# Patient Record
Sex: Female | Born: 1935 | Race: White | Hispanic: No | Smoking: Former smoker
Health system: Southern US, Community
[De-identification: ages and names within clinical notes are randomized; demographics above are authoritative.]

## PROBLEM LIST (undated history)

## (undated) DIAGNOSIS — J449 Chronic obstructive pulmonary disease, unspecified: Secondary | ICD-10-CM

## (undated) DIAGNOSIS — K219 Gastro-esophageal reflux disease without esophagitis: Secondary | ICD-10-CM

## (undated) DIAGNOSIS — I10 Essential (primary) hypertension: Secondary | ICD-10-CM

## (undated) DIAGNOSIS — R0600 Dyspnea, unspecified: Secondary | ICD-10-CM

## (undated) DIAGNOSIS — F32A Depression, unspecified: Secondary | ICD-10-CM

## (undated) DIAGNOSIS — F329 Major depressive disorder, single episode, unspecified: Secondary | ICD-10-CM

## (undated) DIAGNOSIS — F419 Anxiety disorder, unspecified: Secondary | ICD-10-CM

## (undated) HISTORY — PX: ABDOMINAL HYSTERECTOMY: SHX81

## (undated) HISTORY — PX: COLONOSCOPY: SHX174

## (undated) HISTORY — PX: BACK SURGERY: SHX140

## (undated) HISTORY — PX: CYSTECTOMY: SUR359

## (undated) HISTORY — PX: OTHER SURGICAL HISTORY: SHX169

## (undated) HISTORY — PX: ESOPHAGOGASTRODUODENOSCOPY: SHX1529

## (undated) HISTORY — PX: VEIN SURGERY: SHX48

---

## 2017-07-30 ENCOUNTER — Encounter (HOSPITAL_COMMUNITY): Payer: Self-pay

## 2017-07-30 ENCOUNTER — Encounter (HOSPITAL_COMMUNITY)
Admission: AD | Disposition: A | Discharge status: Home Health | Payer: Self-pay | Source: Other Acute Inpatient Hospital | Attending: Family Medicine

## 2017-07-30 ENCOUNTER — Observation Stay (HOSPITAL_COMMUNITY): Payer: Medicare Other | Admitting: Anesthesiology

## 2017-07-30 ENCOUNTER — Observation Stay (HOSPITAL_COMMUNITY)
Admission: AD | Admit: 2017-07-30 | Discharge: 2017-08-01 | Disposition: A | Discharge status: Home Health | Payer: Medicare Other | Source: Other Acute Inpatient Hospital | Attending: Family Medicine | Admitting: Family Medicine

## 2017-07-30 ENCOUNTER — Other Ambulatory Visit: Payer: Self-pay

## 2017-07-30 DIAGNOSIS — Z886 Allergy status to analgesic agent status: Secondary | ICD-10-CM | POA: Diagnosis not present

## 2017-07-30 DIAGNOSIS — J449 Chronic obstructive pulmonary disease, unspecified: Secondary | ICD-10-CM | POA: Insufficient documentation

## 2017-07-30 DIAGNOSIS — K6289 Other specified diseases of anus and rectum: Secondary | ICD-10-CM | POA: Insufficient documentation

## 2017-07-30 DIAGNOSIS — Z79899 Other long term (current) drug therapy: Secondary | ICD-10-CM | POA: Diagnosis not present

## 2017-07-30 DIAGNOSIS — K5730 Diverticulosis of large intestine without perforation or abscess without bleeding: Secondary | ICD-10-CM | POA: Insufficient documentation

## 2017-07-30 DIAGNOSIS — E785 Hyperlipidemia, unspecified: Secondary | ICD-10-CM | POA: Insufficient documentation

## 2017-07-30 DIAGNOSIS — D62 Acute posthemorrhagic anemia: Secondary | ICD-10-CM | POA: Insufficient documentation

## 2017-07-30 DIAGNOSIS — R0609 Other forms of dyspnea: Secondary | ICD-10-CM | POA: Insufficient documentation

## 2017-07-30 DIAGNOSIS — F329 Major depressive disorder, single episode, unspecified: Secondary | ICD-10-CM | POA: Diagnosis not present

## 2017-07-30 DIAGNOSIS — K2950 Unspecified chronic gastritis without bleeding: Secondary | ICD-10-CM | POA: Diagnosis not present

## 2017-07-30 DIAGNOSIS — Z882 Allergy status to sulfonamides status: Secondary | ICD-10-CM | POA: Insufficient documentation

## 2017-07-30 DIAGNOSIS — K449 Diaphragmatic hernia without obstruction or gangrene: Secondary | ICD-10-CM | POA: Diagnosis not present

## 2017-07-30 DIAGNOSIS — D123 Benign neoplasm of transverse colon: Secondary | ICD-10-CM | POA: Diagnosis not present

## 2017-07-30 DIAGNOSIS — K219 Gastro-esophageal reflux disease without esophagitis: Secondary | ICD-10-CM | POA: Insufficient documentation

## 2017-07-30 DIAGNOSIS — R0600 Dyspnea, unspecified: Secondary | ICD-10-CM | POA: Diagnosis not present

## 2017-07-30 DIAGNOSIS — K222 Esophageal obstruction: Secondary | ICD-10-CM | POA: Insufficient documentation

## 2017-07-30 DIAGNOSIS — D120 Benign neoplasm of cecum: Secondary | ICD-10-CM | POA: Diagnosis not present

## 2017-07-30 DIAGNOSIS — K921 Melena: Principal | ICD-10-CM | POA: Diagnosis present

## 2017-07-30 DIAGNOSIS — F419 Anxiety disorder, unspecified: Secondary | ICD-10-CM | POA: Insufficient documentation

## 2017-07-30 DIAGNOSIS — D125 Benign neoplasm of sigmoid colon: Secondary | ICD-10-CM

## 2017-07-30 DIAGNOSIS — I10 Essential (primary) hypertension: Secondary | ICD-10-CM

## 2017-07-30 HISTORY — DX: Dyspnea, unspecified: R06.00

## 2017-07-30 HISTORY — DX: Anxiety disorder, unspecified: F41.9

## 2017-07-30 HISTORY — DX: Depression, unspecified: F32.A

## 2017-07-30 HISTORY — DX: Essential (primary) hypertension: I10

## 2017-07-30 HISTORY — DX: Gastro-esophageal reflux disease without esophagitis: K21.9

## 2017-07-30 HISTORY — DX: Chronic obstructive pulmonary disease, unspecified: J44.9

## 2017-07-30 HISTORY — DX: Major depressive disorder, single episode, unspecified: F32.9

## 2017-07-30 HISTORY — PX: ESOPHAGOGASTRODUODENOSCOPY (EGD) WITH PROPOFOL: SHX5813

## 2017-07-30 LAB — CBC
HCT: 26.8 % — ABNORMAL LOW (ref 36.0–46.0)
Hemoglobin: 8.5 g/dL — ABNORMAL LOW (ref 12.0–15.0)
MCH: 28.6 pg (ref 26.0–34.0)
MCHC: 31.7 g/dL (ref 30.0–36.0)
MCV: 90.2 fL (ref 78.0–100.0)
PLATELETS: 241 10*3/uL (ref 150–400)
RBC: 2.97 MIL/uL — AB (ref 3.87–5.11)
RDW: 17.1 % — ABNORMAL HIGH (ref 11.5–15.5)
WBC: 6.9 10*3/uL (ref 4.0–10.5)

## 2017-07-30 LAB — COMPREHENSIVE METABOLIC PANEL
ALT: 12 U/L — AB (ref 14–54)
AST: 19 U/L (ref 15–41)
Albumin: 3.3 g/dL — ABNORMAL LOW (ref 3.5–5.0)
Alkaline Phosphatase: 55 U/L (ref 38–126)
Anion gap: 12 (ref 5–15)
BUN: 27 mg/dL — AB (ref 6–20)
CHLORIDE: 102 mmol/L (ref 101–111)
CO2: 20 mmol/L — AB (ref 22–32)
Calcium: 8.7 mg/dL — ABNORMAL LOW (ref 8.9–10.3)
Creatinine, Ser: 0.84 mg/dL (ref 0.44–1.00)
GFR calc Af Amer: >60 mL/min (ref >60)
GFR calc non Af Amer: >60 mL/min (ref >60)
GLUCOSE: 96 mg/dL (ref 65–99)
Potassium: 3.6 mmol/L (ref 3.5–5.1)
SODIUM: 134 mmol/L — AB (ref 135–145)
Total Bilirubin: 0.8 mg/dL (ref 0.3–1.2)
Total Protein: 5.9 g/dL — ABNORMAL LOW (ref 6.5–8.1)

## 2017-07-30 SURGERY — ESOPHAGOGASTRODUODENOSCOPY (EGD) WITH PROPOFOL
Anesthesia: Monitor Anesthesia Care

## 2017-07-30 MED ORDER — PANTOPRAZOLE SODIUM 40 MG PO TBEC
40.0000 mg | DELAYED_RELEASE_TABLET | Freq: Two times a day (BID) | ORAL | Status: DC
  Administered 2017-07-30 – 2017-08-01 (×3): 40 mg via ORAL
  Filled 2017-07-30 (×4): qty 1

## 2017-07-30 MED ORDER — MIDAZOLAM HCL 2 MG/2ML IJ SOLN
1.0000 mg | INTRAMUSCULAR | Status: DC
  Administered 2017-07-30: 2 mg via INTRAVENOUS

## 2017-07-30 MED ORDER — POTASSIUM CHLORIDE IN NACL 20-0.9 MEQ/L-% IV SOLN
INTRAVENOUS | Status: DC
  Administered 2017-07-30: 16:00:00 via INTRAVENOUS

## 2017-07-30 MED ORDER — IPRATROPIUM-ALBUTEROL 0.5-2.5 (3) MG/3ML IN SOLN
RESPIRATORY_TRACT | Status: AC
  Filled 2017-07-30: qty 3

## 2017-07-30 MED ORDER — SODIUM CHLORIDE 0.9 % IV SOLN
INTRAVENOUS | Status: DC
  Administered 2017-07-30: 05:00:00 via INTRAVENOUS

## 2017-07-30 MED ORDER — LIDOCAINE VISCOUS 2 % MT SOLN
15.0000 mL | Freq: Once | OROMUCOSAL | Status: AC
  Administered 2017-07-30: 6 mL via OROMUCOSAL

## 2017-07-30 MED ORDER — GABAPENTIN 100 MG PO CAPS
100.0000 mg | ORAL_CAPSULE | Freq: Three times a day (TID) | ORAL | Status: DC
  Administered 2017-07-30 – 2017-08-01 (×7): 100 mg via ORAL
  Filled 2017-07-30 (×7): qty 1

## 2017-07-30 MED ORDER — SODIUM CHLORIDE 0.9 % IV SOLN: INTRAVENOUS | Status: DC

## 2017-07-30 MED ORDER — ATORVASTATIN CALCIUM 10 MG PO TABS
10.0000 mg | ORAL_TABLET | Freq: Every day | ORAL | Status: DC
  Administered 2017-07-30 – 2017-08-01 (×3): 10 mg via ORAL
  Filled 2017-07-30 (×3): qty 1

## 2017-07-30 MED ORDER — CLONAZEPAM 0.5 MG PO TABS: 0.5000 mg | ORAL_TABLET | Freq: Two times a day (BID) | ORAL | Status: DC | PRN

## 2017-07-30 MED ORDER — LIDOCAINE VISCOUS 2 % MT SOLN
OROMUCOSAL | Status: AC
  Filled 2017-07-30: qty 15

## 2017-07-30 MED ORDER — LACTATED RINGERS IV SOLN
INTRAVENOUS | Status: DC
  Administered 2017-07-30: 12:00:00 via INTRAVENOUS

## 2017-07-30 MED ORDER — IPRATROPIUM-ALBUTEROL 0.5-2.5 (3) MG/3ML IN SOLN
3.0000 mL | Freq: Once | RESPIRATORY_TRACT | Status: AC
  Administered 2017-07-30: 3 mL via RESPIRATORY_TRACT

## 2017-07-30 MED ORDER — FLUOXETINE HCL 20 MG PO CAPS
60.0000 mg | ORAL_CAPSULE | Freq: Every day | ORAL | Status: DC
  Administered 2017-07-30 – 2017-08-01 (×3): 60 mg via ORAL
  Filled 2017-07-30 (×4): qty 3

## 2017-07-30 MED ORDER — ACETAMINOPHEN 325 MG PO TABS
650.0000 mg | ORAL_TABLET | Freq: Four times a day (QID) | ORAL | Status: DC | PRN
  Administered 2017-08-01: 650 mg via ORAL
  Filled 2017-07-30: qty 2

## 2017-07-30 MED ORDER — ONDANSETRON HCL 4 MG/2ML IJ SOLN
4.0000 mg | Freq: Four times a day (QID) | INTRAMUSCULAR | Status: DC | PRN
  Administered 2017-07-30: 4 mg via INTRAVENOUS
  Filled 2017-07-30: qty 2

## 2017-07-30 MED ORDER — PANTOPRAZOLE SODIUM 40 MG IV SOLR
40.0000 mg | Freq: Two times a day (BID) | INTRAVENOUS | Status: DC
  Administered 2017-07-30: 40 mg via INTRAVENOUS
  Filled 2017-07-30: qty 40

## 2017-07-30 MED ORDER — SIMETHICONE 40 MG/0.6ML PO SUSP
ORAL | Status: DC | PRN
  Administered 2017-07-30: 13:00:00

## 2017-07-30 MED ORDER — ONDANSETRON HCL 4 MG PO TABS: 4.0000 mg | ORAL_TABLET | Freq: Four times a day (QID) | ORAL | Status: DC | PRN

## 2017-07-30 MED ORDER — MIDAZOLAM HCL 2 MG/2ML IJ SOLN
INTRAMUSCULAR | Status: AC
  Filled 2017-07-30: qty 2

## 2017-07-30 MED ORDER — CARISOPRODOL 350 MG PO TABS
350.0000 mg | ORAL_TABLET | Freq: Four times a day (QID) | ORAL | Status: DC
  Administered 2017-07-30 – 2017-08-01 (×9): 350 mg via ORAL
  Filled 2017-07-30 (×10): qty 1

## 2017-07-30 MED ORDER — PROPRANOLOL HCL 20 MG PO TABS
40.0000 mg | ORAL_TABLET | Freq: Two times a day (BID) | ORAL | Status: DC
  Administered 2017-07-30 – 2017-08-01 (×5): 40 mg via ORAL
  Filled 2017-07-30 (×5): qty 2

## 2017-07-30 MED ORDER — PEG 3350-KCL-NA BICARB-NACL 420 G PO SOLR
4000.0000 mL | Freq: Once | ORAL | Status: AC
  Administered 2017-07-30: 4000 mL via ORAL
  Filled 2017-07-30: qty 4000

## 2017-07-30 MED ORDER — FENTANYL CITRATE (PF) 100 MCG/2ML IJ SOLN
INTRAMUSCULAR | Status: AC
  Filled 2017-07-30: qty 2

## 2017-07-30 MED ORDER — ACETAMINOPHEN 650 MG RE SUPP: 650.0000 mg | Freq: Four times a day (QID) | RECTAL | Status: DC | PRN

## 2017-07-30 MED ORDER — PROPOFOL 500 MG/50ML IV EMUL
INTRAVENOUS | Status: DC | PRN
  Administered 2017-07-30: 150 ug/kg/min via INTRAVENOUS

## 2017-07-30 MED ORDER — HYDROMORPHONE HCL 1 MG/ML IJ SOLN
0.5000 mg | INTRAMUSCULAR | Status: DC | PRN
  Administered 2017-07-30: 0.5 mg via INTRAVENOUS
  Filled 2017-07-30: qty 0.5

## 2017-07-30 NOTE — H&P (View-Only) (Signed)
Referring Provider: No ref. provider found Primary Care Physician:  Patient, No Pcp Per Primary Gastroenterologist:  Dr. Oneida Alar [facility])  Date of Admission: 07/29/17 Date of Consultation: 07/30/17  Reason for Consultation:  Melena, anemia  HPI:  Angela Oconnell is a 82 y.o. female with a past medical history of GERD, hypertension, COPD, anxiety.  She was transferred from [facility] where she reported complaining of black stool at home.  She just received a steroid shot in her hip for osteoarthritis which she has been getting monthly for the past 6 months.  Per hospitalist note she denied nausea, vomiting, hematemesis, hematochezia, chest pain.  However, she was complaining of shortness of breath and a cough.  She denied NSAIDs.  Heme positive in the emergency department at [place], [place].  No history of peptic ulcer disease.  Per the patient she recently had an EGD and colonoscopy done at [place], [place] 2 years ago which was essentially normal.  Hemoglobin in the emergency room in [place], [place] was 9.0.  CBC this morning with a decline to 8.5.  Platelets normal.  CMP shows BUN elevated at 27, normal creatinine.  LFTs essentially normal.  Today she states   Baseline COPD dyspnea worse past couple days. Feels very weak, been in the bed the past couple days. Lower abdominal pain feels like her typical IBS-D symptoms. 3+ black stools yesterday. Has had persistent black BMs today. Denies CP. Denies NSAIDs, ASA powders, ASA. GERD well managed on PPI. No upper abdominal pain. Some nausea/dry heaves, no vomiting. No hematochezia, fever, chills.  Past Medical History:  Diagnosis Date  . Anxiety   . COPD (chronic obstructive pulmonary disease) ([place])   . Depression   . Dyspnea   . GERD (gastroesophageal reflux disease)   . Hypertension       Prior to Admission medications   Medication Sig Start Date End Date Taking? Authorizing Provider  acidophilus  (RISAQUAD) CAPS capsule Take 1 capsule by mouth daily.   Yes [provider]  atorvastatin (LIPITOR) 10 MG tablet Take 10 mg by mouth daily.   Yes [provider]  carisoprodol (SOMA) 350 MG tablet Take 350 mg by mouth 4 (four) times daily.   Yes [provider]  clonazePAM (KLONOPIN) 0.5 MG tablet Take 0.5 mg by mouth 2 (two) times daily as needed for anxiety.   Yes [provider]  FLUoxetine (PROZAC) 20 MG tablet Take 60 mg by mouth daily.   Yes [provider]  gabapentin (NEURONTIN) 300 MG capsule Take 100 mg by mouth 3 (three) times daily.   Yes [provider]  pantoprazole (PROTONIX) 40 MG tablet Take 40 mg by mouth daily.   Yes [provider]  propranolol (INDERAL) 80 MG tablet Take 40 mg by mouth 2 (two) times daily.   Yes [provider]    Current Facility-Administered Medications  Medication Dose Route Frequency Provider Last Rate Last Dose  . 0.9 %  sodium chloride infusion   Intravenous Continuous Oswald Hillock, MD 75 mL/hr at 07/30/17 0519    . acetaminophen (TYLENOL) tablet 650 mg  650 mg Oral Q6H PRN Oswald Hillock, MD       Or  . acetaminophen (TYLENOL) suppository 650 mg  650 mg Rectal Q6H PRN Oswald Hillock, MD      . atorvastatin (LIPITOR) tablet 10 mg  10 mg Oral Daily Oswald Hillock, MD      . carisoprodol (SOMA) tablet 350 mg  350 mg Oral QID Oswald Hillock, MD      . clonazePAM Bobbye Charleston) tablet 0.5 mg  0.5 mg Oral BID PRN Oswald Hillock, MD      . FLUoxetine (PROZAC) capsule 60 mg  60 mg Oral Daily Darrick Meigs, Marge Duncans, MD      . gabapentin (NEURONTIN) capsule 100 mg  100 mg Oral TID Oswald Hillock, MD      . HYDROmorphone (DILAUDID) injection 0.5 mg  0.5 mg Intravenous Q4H PRN Oswald Hillock, MD      . ondansetron (ZOFRAN) tablet 4 mg  4 mg Oral Q6H PRN Oswald Hillock, MD       Or  . ondansetron (ZOFRAN) injection 4 mg  4 mg Intravenous Q6H PRN Oswald Hillock, MD      . pantoprazole (PROTONIX) injection 40  mg  40 mg Intravenous Q12H Lama, Marge Duncans, MD      . propranolol (INDERAL) tablet 40 mg  40 mg Oral BID Oswald Hillock, MD        Allergies as of 07/30/2017 - never reviewed  Allergen Reaction Noted  . Motrin [ibuprofen]  07/30/2017  . Sulfur  07/30/2017    No family history on file.  Social History   Socioeconomic History  . Marital status: Unknown    Spouse name: Not on file  . Number of children: Not on file  . Years of education: Not on file  . Highest education level: Not on file  Social Needs  . Financial resource strain: Not on file  . Food insecurity - worry: Not on file  . Food insecurity - inability: Not on file  . Transportation needs - medical: Not on file  . Transportation needs - non-medical: Not on file  Occupational History  . Not on file  Tobacco Use  . Smoking status: Not on file  Substance and Sexual Activity  . Alcohol use: No    Frequency: Never  . Drug use: No  . Sexual activity: No  Other Topics Concern  . Not on file  Social History Narrative  . Not on file    Review of Systems: General: Negative for anorexia, weight loss, fever, chills, fatigue, weakness. Eyes: Negative for vision changes.  ENT: Negative for hoarseness, difficulty swallowing , nasal congestion. CV: Negative for chest pain, angina, palpitations, dyspnea on exertion, peripheral edema.  Respiratory: Negative for dyspnea at rest, dyspnea on exertion, cough, sputum, wheezing.  GI: See history of present illness. GU:  Negative for dysuria, hematuria, urinary incontinence, urinary frequency, nocturnal urination.  MS: Negative for joint pain, low back pain.  Derm: Negative for rash or itching.  Neuro: Negative for weakness, abnormal sensation, seizure, frequent headaches, memory loss, confusion.  Psych: Negative for anxiety, depression, suicidal ideation, hallucinations.  Endo: Negative for unusual weight change.  Heme: Negative for bruising or bleeding. Allergy: Negative for rash  or hives.  Physical Exam: Vital signs in last 24 hours: Temp:  [98.6 F (37 C)] 98.6 F (37 C) (03/08 0328) Pulse Rate:  [82] 82 (03/08 0328) Resp:  [18] 18 (03/08 0328) BP: (160)/(92) 160/92 (03/08 0328) SpO2:  [95 %] 95 % (03/08 0328) Weight:  [120 lb 9.5 oz (54.7 kg)] 120 lb 9.5 oz (54.7 kg) (03/08 0328) Last BM Date: 07/30/17 General:   Alert,  Well-developed, well-nourished, pleasant and cooperative in NAD Head:  Normocephalic and atraumatic. Eyes:  Sclera clear, no icterus.   Conjunctiva pink. Ears:  Normal auditory acuity. Nose:  No deformity, discharge,  or lesions. Mouth:  No deformity or lesions, dentition normal. Neck:  Supple; no masses or thyromegaly. Lungs:  Clear throughout to auscultation.   No wheezes, crackles, or rhonchi. No acute distress. Heart:  Regular rate and rhythm; no murmurs, clicks, rubs,  or gallops. Abdomen:  Soft, nontender and nondistended. No masses, hepatosplenomegaly or hernias noted. Normal bowel sounds, without guarding, and without rebound.   Rectal:  Deferred until time of colonoscopy.   Msk:  Symmetrical without gross deformities. Normal posture. Pulses:  Normal pulses noted. Extremities:  Without clubbing or edema. Neurologic:  Alert and  oriented x4;  grossly normal neurologically. Skin:  Intact without significant lesions or rashes. Cervical Nodes:  No significant cervical adenopathy. Psych:  Alert and cooperative. Normal mood and affect.  Intake/Output from previous day: No intake/output data recorded. Intake/Output this shift: No intake/output data recorded.  Lab Results: Recent Labs    07/30/17 0548  WBC 6.9  HGB 8.5*  HCT 26.8*  PLT 241   BMET Recent Labs    07/30/17 0548  NA 134*  K 3.6  CL 102  CO2 20*  GLUCOSE 96  BUN 27*  CREATININE 0.84  CALCIUM 8.7*   LFT Recent Labs    07/30/17 0548  PROT 5.9*  ALBUMIN 3.3*  AST 19  ALT 12*  ALKPHOS 55  BILITOT 0.8   PT/INR No results for input(s): LABPROT,  INR in the last 72 hours. Hepatitis Panel No results for input(s): HEPBSAG, HCVAB, HEPAIGM, HEPBIGM in the last 72 hours. C-Diff No results for input(s): CDIFFTOX in the last 72 hours.  Studies/Results: No results found.  Impression:   Plan: ? EGD ??PROPOFOL CBC this afternoon Monitor for GIB Transfuse as necessary   LOS: 1 day     07/30/2017, 8:24 AM

## 2017-07-30 NOTE — Transfer of Care (Signed)
Immediate Anesthesia Transfer of Care Note  Patient: Angela Oconnell  Procedure(s) Performed: ESOPHAGOGASTRODUODENOSCOPY (EGD) WITH PROPOFOL (N/A )  Patient Location: PACU  Anesthesia Type:MAC  Level of Consciousness: awake and alert   Airway & Oxygen Therapy: Patient Spontanous Breathing and Patient connected to nasal cannula oxygen  Post-op Assessment: Report given to RN  Post vital signs: Reviewed and stable  Last Vitals:  Vitals:   07/30/17 0328 07/30/17 1145  BP: (!) 160/92 (!) 117/58  Pulse: 82 71  Resp: 18   Temp: 37 C 36.8 C  SpO2: 95%     Last Pain:  Vitals:   07/30/17 1145  TempSrc: Oral  PainSc: 0-No pain      Patients Stated Pain Goal: 5 (98/33/82 5053)  Complications: No apparent anesthesia complications

## 2017-07-30 NOTE — Progress Notes (Signed)
PROGRESS NOTE    Angela Oconnell  WJX:[id number]  DOB: 1935-12-26  DOA: 07/30/2017 PCP: Patient, No Pcp Per   Brief Admission Hx: Angela Oconnell  is a 82 y.o. female, with history of depression, hypertension, COPD who came to the hospital after she noticed black stool at home.   MDM/Assessment & Plan:     1. Melena-unclear etiology, EGD was unrevealing as cause of melena, GI planning for colon survey tomorrow with Dr. Laural Golden.  Colon prep in process to start now.  2. Anemia-patient's hemoglobin was around 9, will repeat CBC in a.m. vitals are stable.  Stool guaiac was positive in the ED 3. Hyperlipidemia-continue Lipitor 4. Depression-continue Klonopin, Prozac 5. Hypertension-blood pressure stable, continue propranolol  DVT Prophylaxis-   SCDs   AM Labs Ordered, also please review Full Orders  Family Communication: Admission, patients condition and plan of care including tests being ordered have been discussed with the patient  who indicate understanding and agree with the plan and Code Status.  Code Status:  DNR  Consultants:  GI  Procedures:  EGD   Colonoscopy   Subjective: Pt without complaints, wants to eat something, anxious for procedure later today  Objective: Vitals:   07/30/17 1145 07/30/17 1315 07/30/17 1330 07/30/17 1340  BP: (!) 117/58 (!) 92/50 (!) 106/54   Pulse: 71 62 67 65  Resp:  19 (!) 21 (!) 22  Temp: 98.2 F (36.8 C) (!) 97.2 F (36.2 C)    TempSrc: Oral     SpO2:  95% 96% 95%  Weight: 54.4 kg (120 lb)     Height: 4\' 10"  (1.473 m)       Intake/Output Summary (Last 24 hours) at 07/30/2017 1518 Last data filed at 07/30/2017 1318 Gross per 24 hour  Intake 800 ml  Output 0 ml  Net 800 ml   Filed Weights   07/30/17 0328 07/30/17 1145  Weight: 54.7 kg (120 lb 9.5 oz) 54.4 kg (120 lb)     REVIEW OF SYSTEMS  As per history otherwise all reviewed and reported negative  Exam:  General exam: awake, alert, NAD, cooperative.    Respiratory system: Clear. No increased work of breathing. Cardiovascular system: S1 & S2 heard, RRR. No JVD, murmurs, gallops, clicks or pedal edema. Gastrointestinal system: Abdomen is nondistended, soft and nontender. Normal bowel sounds heard. Central nervous system: Alert and oriented. No focal neurological deficits. Extremities: no CCE.  Data Reviewed: Basic Metabolic Panel: Recent Labs  Lab 07/30/17 0548  NA 134*  K 3.6  CL 102  CO2 20*  GLUCOSE 96  BUN 27*  CREATININE 0.84  CALCIUM 8.7*   Liver Function Tests: Recent Labs  Lab 07/30/17 0548  AST 19  ALT 12*  ALKPHOS 55  BILITOT 0.8  PROT 5.9*  ALBUMIN 3.3*   No results for input(s): LIPASE, AMYLASE in the last 168 hours. No results for input(s): AMMONIA in the last 168 hours. CBC: Recent Labs  Lab 07/30/17 0548  WBC 6.9  HGB 8.5*  HCT 26.8*  MCV 90.2  PLT 241   Cardiac Enzymes: No results for input(s): CKTOTAL, CKMB, CKMBINDEX, TROPONINI in the last 168 hours. CBG (last 3)  No results for input(s): GLUCAP in the last 72 hours. No results found for this or any previous visit (from the past 240 hour(s)).   Studies: No results found.  Scheduled Meds: . atorvastatin  10 mg Oral Daily  . carisoprodol  350 mg Oral QID  . FLUoxetine  60 mg Oral  Daily  . gabapentin  100 mg Oral TID  . pantoprazole  40 mg Oral BID AC  . propranolol  40 mg Oral BID   Continuous Infusions: . 0.9 % NaCl with KCl 20 mEq / L      Active Problems:   Melena   HTN (hypertension)   Time spent:   Irwin Brakeman, MD, FAAFP [facility] Pager [phone]  If 7PM-7AM, please contact night-coverage [email or web address] Password [email or web address] 07/30/2017, 3:18 PM    LOS: 1 day

## 2017-07-30 NOTE — Plan of Care (Signed)
SPOKE WITH SON, MICHAEL Hults. DISCUSSED PROCEDURE, BENEFITS, & RISKS: < 1% chance of medication reaction, bleeding, perforation, or rupture of spleen/liver. AWARE DR. Laural Golden WILL DO TCS W/ MAC ON MAR 9.

## 2017-07-30 NOTE — Anesthesia Preprocedure Evaluation (Addendum)
Anesthesia Evaluation  Patient identified by MRN, date of birth, ID band Patient awake    Reviewed: Allergy & Precautions, NPO status , Patient's Chart, lab work & pertinent test results, reviewed documented beta blocker date and time   Airway Mallampati: I  TM Distance: >3 FB Neck ROM: Full    Dental  (+) Teeth Intact, Partial Upper   Pulmonary shortness of breath and with exertion, COPD,    breath sounds clear to auscultation       Cardiovascular hypertension, Pt. on medications and Pt. on home beta blockers + DOE   Rhythm:Regular Rate:Normal     Neuro/Psych PSYCHIATRIC DISORDERS Anxiety Depression    GI/Hepatic GERD  ,  Endo/Other    Renal/GU      Musculoskeletal   Abdominal   Peds  Hematology   Anesthesia Other Findings   Reproductive/Obstetrics                             Anesthesia Physical Anesthesia Plan  ASA: III  Anesthesia Plan: MAC   Post-op Pain Management:    Induction: Intravenous  PONV Risk Score and Plan:   Airway Management Planned: Simple Face Mask  Additional Equipment:   Intra-op Plan:   Post-operative Plan:   Informed Consent: I have reviewed the patients History and Physical, chart, labs and discussed the procedure including the risks, benefits and alternatives for the proposed anesthesia with the patient or authorized representative who has indicated his/her understanding and acceptance.     Plan Discussed with:   Anesthesia Plan Comments:         Anesthesia Quick Evaluation

## 2017-07-30 NOTE — Care Management Obs Status (Signed)
[facility] NOTIFICATION   Patient Details  Name: Angela Oconnell MRN: [id number] Date of Birth: 1935-10-31   Medicare Observation Status Notification Given:  Yes    Sherald Barge, RN 07/30/2017, 2:04 PM

## 2017-07-30 NOTE — H&P (Signed)
TRH H&P    Patient Demographics:    Angela Oconnell, is a 82 y.o. female  MRN: [id number]  DOB - Oct 08, 1935  Admit Date - 07/30/2017   Outpatient Primary MD for the patient is Patient, No Pcp Per  Patient coming from: Transferred from [facility]   Chief complaint- Black stool   HPI:    Angela Oconnell  is a 82 y.o. female, with history of depression, hypertension, COPD who came to the hospital after she noticed black stool at home.  Patient says that she got steroid shot in her hip for osteoarthritis and she has been getting this once a month over the past 6 months.  After she got the shot she noticed black colored stool and abdominal pain.  She denies nausea Vomiting or diarrhea.  Denies vomiting any blood. No bright red blood per rectum No chest pain,  but complains of shortness of breath. Also complains of cough  She denies taking NSAIDs.  Guaiac was positive in the ED at [facility]  No previous history of gastric ulcer.  She had a EGD and colonoscopy done at [facility] 2 years ago which did not show significant abnormality.     Review of systems:     All other systems reviewed and are negative.   With Past History of the following :    Past Medical History:  Diagnosis Date  . Anxiety   . COPD (chronic obstructive pulmonary disease) ([place])   . Depression   . Dyspnea   . GERD (gastroesophageal reflux disease)   . Hypertension          Social History:      Social History   Tobacco Use  . Smoking status: Not on file  Substance Use Topics  . Alcohol use: No    Frequency: Never       Family History :   Patient father died of MI at the age of 31   Home Medications:   Prior to Admission medications   Medication Sig Start Date End Date Taking? Authorizing Provider  acidophilus (RISAQUAD) CAPS capsule Take 1 capsule by mouth daily.   Yes [provider]    atorvastatin (LIPITOR) 10 MG tablet Take 10 mg by mouth daily.   Yes [provider]  carisoprodol (SOMA) 350 MG tablet Take 350 mg by mouth 4 (four) times daily.   Yes [provider]  clonazePAM (KLONOPIN) 0.5 MG tablet Take 0.5 mg by mouth 2 (two) times daily as needed for anxiety.   Yes [provider]  FLUoxetine (PROZAC) 20 MG tablet Take 60 mg by mouth daily.   Yes [provider]  gabapentin (NEURONTIN) 300 MG capsule Take 100 mg by mouth 3 (three) times daily.   Yes [provider]  pantoprazole (PROTONIX) 40 MG tablet Take 40 mg by mouth daily.   Yes [provider]  propranolol (INDERAL) 80 MG tablet Take 40 mg by mouth 2 (two) times daily.   Yes [provider]     Allergies:  Allergies  Allergen Reactions  . Motrin [Ibuprofen]   . Sulfur      Physical Exam:   Vitals  Blood pressure (!) 160/92, pulse 82, temperature 98.6 F (37 C), temperature source Oral, resp. rate 18, height 4\' 10"  (1.473 m), weight 54.7 kg (120 lb 9.5 oz), SpO2 95 %.  1.  General: Appears in no acute distress  2. Psychiatric:  Intact judgement and  insight, awake alert, oriented x 3.  3. Neurologic: No focal neurological deficits, all cranial nerves intact.Strength 5/5 all 4 extremities, sensation intact all 4 extremities, plantars down going.  4. Eyes :  anicteric sclerae, moist conjunctivae with no lid lag. PERRLA.  5. ENMT:  Oropharynx clear with moist mucous membranes and good dentition  6. Neck:  supple, no cervical lymphadenopathy appriciated, No thyromegaly  7. Respiratory : Normal respiratory effort, good air movement bilaterally,clear to  auscultation bilaterally  8. Cardiovascular : RRR, no gallops, rubs or murmurs, no leg edema  9. Gastrointestinal:  Positive bowel sounds, abdomen soft, non-tender to palpation,no hepatosplenomegaly, no rigidity or guarding       10. Skin:  No cyanosis, normal texture and  turgor, no rash, lesions or ulcers  11.Musculoskeletal:  Good muscle tone,  joints appear normal , no effusions,  normal range of motion    Data Review:    CBC No results for input(s): WBC, HGB, HCT, PLT, MCV, MCH, MCHC, RDW, LYMPHSABS, MONOABS, EOSABS, BASOSABS, BANDABS in the last 168 hours.  Invalid input(s): NEUTRABS, BANDSABD ------------------------------------------------------------------------------------------------------------------  Chemistries  No results for input(s): NA, K, CL, CO2, GLUCOSE, BUN, CREATININE, CALCIUM, MG, AST, ALT, ALKPHOS, BILITOT in the last 168 hours.  Invalid input(s): GFRCGP ------------------------------------------------------------------------------------------------------------------  ------------------------------------------------------------------------------------------------------------------  --------------------------------------------------------------------------------------------------------------- Urine analysis: No results found for: COLORURINE, APPEARANCEUR, LABSPEC, [place], GLUCOSEU, HGBUR, BILIRUBINUR, KETONESUR, PROTEINUR, UROBILINOGEN, NITRITE, LEUKOCYTESUR    Imaging Results:       Assessment & Plan:    Active Problems:   Melena   1. Melena-unclear etiology, will keep her n.p.o, start IV Protonix, GI consult in a.m for possible EGD 2. Anemia-patient's hemoglobin was around 9, will repeat CBC in a.m. vitals are stable.  Stool guaiac was positive in the ED 3. Hyperlipidemia-continue Lipitor 4. Depression-continue Klonopin, Prozac 5. Hypertension-blood pressure stable, continue propranolol    DVT Prophylaxis-   SCDs   AM Labs Ordered, also please review Full Orders  Family Communication: Admission, patients condition and plan of care including tests being ordered have been discussed with the patient  who indicate understanding and agree with the plan and Code Status.  Code Status:  DNR  Admission status:  Observation  Time spent in minutes : 60 min   Oswald Hillock M.D on 07/30/2017 at 4:47 AM  Between 7am to 7pm - Pager - [phone]. After 7pm go to [email or web address] - password [facility]  [facility] - Office  [phone]

## 2017-07-30 NOTE — Interval H&P Note (Signed)
History and Physical Interval Note:  07/30/2017 11:54 AM  Angela Oconnell  has presented today for surgery, with the diagnosis of Melena, symptomatic anemia  The various methods of treatment have been discussed with the patient and family. After consideration of risks, benefits and other options for treatment, the patient has consented to  Procedure(s): ESOPHAGOGASTRODUODENOSCOPY (EGD) WITH PROPOFOL (N/A) as a surgical intervention .  The patient's history has been reviewed, patient examined, no change in status, stable for surgery.  I have reviewed the patient's chart and labs.  Questions were answered to the patient's satisfaction.     [employer]

## 2017-07-30 NOTE — Op Note (Signed)
[facility] Patient Name: Angela Oconnell Procedure Date: 07/30/2017 12:37 PM MRN: [id number] Date of Birth: 10/21/1935 Attending MD: Barney Drain MD, MD CSN: [id number] Age: 82 Admit Type: Outpatient Procedure:                Upper GI endoscopy WITH COLD FORCEPS BIOPSY Indications:              Melena-Hb 8.5 MCV 90.5. Providers:                Barney Drain MD, MD, Charlsie Quest. Theda Sers RN, RN,                            Nelma Rothman, Technician Referring MD:              Medicines:                Propofol per Anesthesia Complications:            No immediate complications. Estimated Blood Loss:     Estimated blood loss was minimal. Procedure:                Pre-Anesthesia Assessment:                           - Prior to the procedure, a History and Physical                            was performed, and patient medications and                            allergies were reviewed. The patient's tolerance of                            previous anesthesia was also reviewed. The risks                            and benefits of the procedure and the sedation                            options and risks were discussed with the patient.                            All questions were answered, and informed consent                            was obtained. Prior Anticoagulants: The patient has                            taken no previous anticoagulant or antiplatelet                            agents. ASA Grade Assessment: II - A patient with                            mild systemic disease. After reviewing the risks  and benefits, the patient was deemed in                            satisfactory condition to undergo the procedure.                            After obtaining informed consent, the endoscope was                            passed under direct vision. Throughout the                            procedure, the patient's blood pressure, pulse, and       oxygen saturations were monitored continuously. The                            EC-3490TLi ([id number]) scope was introduced through                            the mouth, and advanced to the second part of                            duodenum. The upper GI endoscopy was accomplished                            without difficulty. The patient tolerated the                            procedure well. Scope In: 1:01:41 PM Scope Out: 1:09:14 PM Total Procedure Duration: 0 hours 7 minutes 33 seconds  Findings:      One moderate (circumferential scarring or stenosis; an endoscope may       pass) benign-appearing, intrinsic stenosis was found. And was traversed.      A large hiatal hernia was present.      Patchy mildly erythematous mucosa without bleeding was found in the       gastric fundus. This was biopsied with a cold forceps for histology.      Patchy mild inflammation characterized by congestion (edema) and       erythema was found in the gastric antrum. Biopsies were taken with a       cold forceps for Helicobacter pylori testing.      The examined duodenum was normal. Impression:               - Benign-appearing esophageal stricture.                           - Large hiatal hernia.                           - Erythematous mucosa in the gastric fundus                            CONSISTENT WITH EARLY CAMERON'S LESIONS                           -  MILD Gastritis.                           - NO SOURCE FOR MELENA IDENTIFIED Moderate Sedation:      Per Anesthesia Care Recommendation:           - Await pathology results.                           - Clear liquid diet. NPO AFTER MN EXCEPT SIPS WITH                            MEDS.                           - Continue present medications. PREP FOR                            COLONOSCOPY MAR 9 W/ MAC.                           - Return patient to hospital ward for ongoing care. Procedure Code(s):        --- Professional ---                            [phone], Esophagogastroduodenoscopy, flexible,                            transoral; with biopsy, single or multiple Diagnosis Code(s):        --- Professional ---                           K22.2, Esophageal obstruction                           K44.9, Diaphragmatic hernia without obstruction or                            gangrene                           K31.89, Other diseases of stomach and duodenum                           K29.70, Gastritis, unspecified, without bleeding                           K92.1, Melena (includes Hematochezia) CPT copyright 2016 American Medical Association. All rights reserved. The codes documented in this report are preliminary and upon coder review may  be revised to meet current compliance requirements. Barney Drain, MD Barney Drain MD, MD 07/30/2017 1:21:20 PM This report has been signed electronically. Number of Addenda: 0

## 2017-07-30 NOTE — Anesthesia Postprocedure Evaluation (Signed)
Anesthesia Post Note  Patient: Angela Oconnell  Procedure(s) Performed: ESOPHAGOGASTRODUODENOSCOPY (EGD) WITH PROPOFOL (N/A )  Patient location during evaluation: PACU Anesthesia Type: MAC Level of consciousness: awake and alert and oriented Pain management: pain level controlled Vital Signs Assessment: post-procedure vital signs reviewed and stable Respiratory status: spontaneous breathing Cardiovascular status: blood pressure returned to baseline and stable Postop Assessment: no apparent nausea or vomiting Anesthetic complications: no     Last Vitals:  Vitals:   07/30/17 1315 07/30/17 1330  BP: (!) 92/50 (!) 106/54  Pulse: 62 67  Resp: 19 (!) 21  Temp: (!) 36.2 C   SpO2: 95% 96%    Last Pain:  Vitals:   07/30/17 1145  TempSrc: Oral  PainSc: 0-No pain                 Emil Weigold

## 2017-07-30 NOTE — Consult Note (Signed)
Referring Provider: No ref. provider found Primary Care Physician:  Patient, No Pcp Per Primary Gastroenterologist:  Dr. Oneida Alar [facility])  Date of Admission: 07/29/17 Date of Consultation: 07/30/17  Reason for Consultation:  Melena, anemia  HPI:  Angela Oconnell is a 82 y.o. female with a past medical history of GERD, hypertension, COPD, anxiety.  She was transferred from [facility] where she reported complaining of black stool at home.  She just received a steroid shot in her hip for osteoarthritis which she has been getting monthly for the past 6 months.  Per hospitalist note she denied nausea, vomiting, hematemesis, hematochezia, chest pain.  However, she was complaining of shortness of breath and a cough.  She denied NSAIDs.  Heme positive in the emergency department at [place], [place].  No history of peptic ulcer disease.  Per the patient she recently had an EGD and colonoscopy done at [place], [place] 2 years ago which was essentially normal.  Hemoglobin in the emergency room in [place], [place] was 9.0.  CBC this morning with a decline to 8.5.  Platelets normal.  CMP shows BUN elevated at 27, normal creatinine.  LFTs essentially normal.  Today she states   Baseline COPD dyspnea worse past couple days. Feels very weak, been in the bed the past couple days. Lower abdominal pain feels like her typical IBS-D symptoms. 3+ black stools yesterday. Has had persistent black BMs today. Denies CP. Denies NSAIDs, ASA powders, ASA. GERD well managed on PPI. No upper abdominal pain. Some nausea/dry heaves, no vomiting. No hematochezia, fever, chills.  Past Medical History:  Diagnosis Date  . Anxiety   . COPD (chronic obstructive pulmonary disease) ([place])   . Depression   . Dyspnea   . GERD (gastroesophageal reflux disease)   . Hypertension       Prior to Admission medications   Medication Sig Start Date End Date Taking? Authorizing Provider  acidophilus  (RISAQUAD) CAPS capsule Take 1 capsule by mouth daily.   Yes [provider]  atorvastatin (LIPITOR) 10 MG tablet Take 10 mg by mouth daily.   Yes [provider]  carisoprodol (SOMA) 350 MG tablet Take 350 mg by mouth 4 (four) times daily.   Yes [provider]  clonazePAM (KLONOPIN) 0.5 MG tablet Take 0.5 mg by mouth 2 (two) times daily as needed for anxiety.   Yes [provider]  FLUoxetine (PROZAC) 20 MG tablet Take 60 mg by mouth daily.   Yes [provider]  gabapentin (NEURONTIN) 300 MG capsule Take 100 mg by mouth 3 (three) times daily.   Yes [provider]  pantoprazole (PROTONIX) 40 MG tablet Take 40 mg by mouth daily.   Yes [provider]  propranolol (INDERAL) 80 MG tablet Take 40 mg by mouth 2 (two) times daily.   Yes [provider]    Current Facility-Administered Medications  Medication Dose Route Frequency Provider Last Rate Last Dose  . 0.9 %  sodium chloride infusion   Intravenous Continuous Oswald Hillock, MD 75 mL/hr at 07/30/17 0519    . acetaminophen (TYLENOL) tablet 650 mg  650 mg Oral Q6H PRN Oswald Hillock, MD       Or  . acetaminophen (TYLENOL) suppository 650 mg  650 mg Rectal Q6H PRN Oswald Hillock, MD      . atorvastatin (LIPITOR) tablet 10 mg  10 mg Oral Daily Oswald Hillock, MD      . carisoprodol (SOMA) tablet 350 mg  350 mg Oral QID Oswald Hillock, MD      . clonazePAM Bobbye Charleston) tablet 0.5 mg  0.5 mg Oral BID PRN Oswald Hillock, MD      . FLUoxetine (PROZAC) capsule 60 mg  60 mg Oral Daily Darrick Meigs, Marge Duncans, MD      . gabapentin (NEURONTIN) capsule 100 mg  100 mg Oral TID Oswald Hillock, MD      . HYDROmorphone (DILAUDID) injection 0.5 mg  0.5 mg Intravenous Q4H PRN Oswald Hillock, MD      . ondansetron (ZOFRAN) tablet 4 mg  4 mg Oral Q6H PRN Oswald Hillock, MD       Or  . ondansetron (ZOFRAN) injection 4 mg  4 mg Intravenous Q6H PRN Oswald Hillock, MD      . pantoprazole (PROTONIX) injection 40  mg  40 mg Intravenous Q12H Lama, Marge Duncans, MD      . propranolol (INDERAL) tablet 40 mg  40 mg Oral BID Oswald Hillock, MD        Allergies as of 07/30/2017 - never reviewed  Allergen Reaction Noted  . Motrin [ibuprofen]  07/30/2017  . Sulfur  07/30/2017    No family history on file.  Social History   Socioeconomic History  . Marital status: Unknown    Spouse name: Not on file  . Number of children: Not on file  . Years of education: Not on file  . Highest education level: Not on file  Social Needs  . Financial resource strain: Not on file  . Food insecurity - worry: Not on file  . Food insecurity - inability: Not on file  . Transportation needs - medical: Not on file  . Transportation needs - non-medical: Not on file  Occupational History  . Not on file  Tobacco Use  . Smoking status: Not on file  Substance and Sexual Activity  . Alcohol use: No    Frequency: Never  . Drug use: No  . Sexual activity: No  Other Topics Concern  . Not on file  Social History Narrative  . Not on file    Review of Systems: General: Negative for anorexia, weight loss, fever, chills, fatigue, weakness. Eyes: Negative for vision changes.  ENT: Negative for hoarseness, difficulty swallowing , nasal congestion. CV: Negative for chest pain, angina, palpitations, dyspnea on exertion, peripheral edema.  Respiratory: Negative for dyspnea at rest, dyspnea on exertion, cough, sputum, wheezing.  GI: See history of present illness. GU:  Negative for dysuria, hematuria, urinary incontinence, urinary frequency, nocturnal urination.  MS: Negative for joint pain, low back pain.  Derm: Negative for rash or itching.  Neuro: Negative for weakness, abnormal sensation, seizure, frequent headaches, memory loss, confusion.  Psych: Negative for anxiety, depression, suicidal ideation, hallucinations.  Endo: Negative for unusual weight change.  Heme: Negative for bruising or bleeding. Allergy: Negative for rash  or hives.  Physical Exam: Vital signs in last 24 hours: Temp:  [98.6 F (37 C)] 98.6 F (37 C) (03/08 0328) Pulse Rate:  [82] 82 (03/08 0328) Resp:  [18] 18 (03/08 0328) BP: (160)/(92) 160/92 (03/08 0328) SpO2:  [95 %] 95 % (03/08 0328) Weight:  [120 lb 9.5 oz (54.7 kg)] 120 lb 9.5 oz (54.7 kg) (03/08 0328) Last BM Date: 07/30/17 General:   Alert,  Well-developed, well-nourished, pleasant and cooperative in NAD Head:  Normocephalic and atraumatic. Eyes:  Sclera clear, no icterus.   Conjunctiva pink. Ears:  Normal auditory acuity. Nose:  No deformity, discharge,  or lesions. Mouth:  No deformity or lesions, dentition normal. Neck:  Supple; no masses or thyromegaly. Lungs:  Clear throughout to auscultation.   No wheezes, crackles, or rhonchi. No acute distress. Heart:  Regular rate and rhythm; no murmurs, clicks, rubs,  or gallops. Abdomen:  Soft, nontender and nondistended. No masses, hepatosplenomegaly or hernias noted. Normal bowel sounds, without guarding, and without rebound.   Rectal:  Deferred until time of colonoscopy.   Msk:  Symmetrical without gross deformities. Normal posture. Pulses:  Normal pulses noted. Extremities:  Without clubbing or edema. Neurologic:  Alert and  oriented x4;  grossly normal neurologically. Skin:  Intact without significant lesions or rashes. Cervical Nodes:  No significant cervical adenopathy. Psych:  Alert and cooperative. Normal mood and affect.  Intake/Output from previous day: No intake/output data recorded. Intake/Output this shift: No intake/output data recorded.  Lab Results: Recent Labs    07/30/17 0548  WBC 6.9  HGB 8.5*  HCT 26.8*  PLT 241   BMET Recent Labs    07/30/17 0548  NA 134*  K 3.6  CL 102  CO2 20*  GLUCOSE 96  BUN 27*  CREATININE 0.84  CALCIUM 8.7*   LFT Recent Labs    07/30/17 0548  PROT 5.9*  ALBUMIN 3.3*  AST 19  ALT 12*  ALKPHOS 55  BILITOT 0.8   PT/INR No results for input(s): LABPROT,  INR in the last 72 hours. Hepatitis Panel No results for input(s): HEPBSAG, HCVAB, HEPAIGM, HEPBIGM in the last 72 hours. C-Diff No results for input(s): CDIFFTOX in the last 72 hours.  Studies/Results: No results found.  Impression:   Plan: ? EGD ??PROPOFOL CBC this afternoon Monitor for GIB Transfuse as necessary   LOS: 1 day     07/30/2017, 8:24 AM

## 2017-07-31 ENCOUNTER — Observation Stay (HOSPITAL_COMMUNITY): Payer: Medicare Other | Admitting: Anesthesiology

## 2017-07-31 ENCOUNTER — Encounter (HOSPITAL_COMMUNITY): Payer: Self-pay | Admitting: *Deleted

## 2017-07-31 ENCOUNTER — Encounter (HOSPITAL_COMMUNITY)
Admission: AD | Disposition: A | Discharge status: Home Health | Payer: Self-pay | Source: Other Acute Inpatient Hospital | Attending: Family Medicine

## 2017-07-31 DIAGNOSIS — K921 Melena: Secondary | ICD-10-CM | POA: Diagnosis not present

## 2017-07-31 DIAGNOSIS — D123 Benign neoplasm of transverse colon: Secondary | ICD-10-CM

## 2017-07-31 DIAGNOSIS — D62 Acute posthemorrhagic anemia: Secondary | ICD-10-CM | POA: Diagnosis not present

## 2017-07-31 DIAGNOSIS — I10 Essential (primary) hypertension: Secondary | ICD-10-CM | POA: Diagnosis not present

## 2017-07-31 DIAGNOSIS — D125 Benign neoplasm of sigmoid colon: Secondary | ICD-10-CM

## 2017-07-31 DIAGNOSIS — K6289 Other specified diseases of anus and rectum: Secondary | ICD-10-CM | POA: Diagnosis not present

## 2017-07-31 DIAGNOSIS — D120 Benign neoplasm of cecum: Secondary | ICD-10-CM | POA: Diagnosis not present

## 2017-07-31 DIAGNOSIS — K5730 Diverticulosis of large intestine without perforation or abscess without bleeding: Secondary | ICD-10-CM | POA: Diagnosis not present

## 2017-07-31 HISTORY — PX: POLYPECTOMY: SHX5525

## 2017-07-31 HISTORY — PX: COLONOSCOPY WITH PROPOFOL: SHX5780

## 2017-07-31 LAB — CBC
HEMATOCRIT: 26.4 % — AB (ref 36.0–46.0)
Hemoglobin: 8.2 g/dL — ABNORMAL LOW (ref 12.0–15.0)
MCH: 28.7 pg (ref 26.0–34.0)
MCHC: 31.1 g/dL (ref 30.0–36.0)
MCV: 92.3 fL (ref 78.0–100.0)
PLATELETS: 232 10*3/uL (ref 150–400)
RBC: 2.86 MIL/uL — AB (ref 3.87–5.11)
RDW: 16.9 % — AB (ref 11.5–15.5)
WBC: 5.6 10*3/uL (ref 4.0–10.5)

## 2017-07-31 LAB — ABO/RH: ABO/RH(D): O POS

## 2017-07-31 LAB — PREPARE RBC (CROSSMATCH)

## 2017-07-31 LAB — HEMOGLOBIN AND HEMATOCRIT, BLOOD
HEMATOCRIT: 29.1 % — AB (ref 36.0–46.0)
HEMOGLOBIN: 9.3 g/dL — AB (ref 12.0–15.0)

## 2017-07-31 SURGERY — COLONOSCOPY WITH PROPOFOL
Anesthesia: Monitor Anesthesia Care

## 2017-07-31 MED ORDER — FENTANYL CITRATE (PF) 100 MCG/2ML IJ SOLN
INTRAMUSCULAR | Status: DC | PRN
  Administered 2017-07-31: 25 ug via INTRAVENOUS

## 2017-07-31 MED ORDER — FENTANYL CITRATE (PF) 100 MCG/2ML IJ SOLN
INTRAMUSCULAR | Status: AC
  Filled 2017-07-31: qty 2

## 2017-07-31 MED ORDER — ONDANSETRON HCL 4 MG/2ML IJ SOLN
INTRAMUSCULAR | Status: DC | PRN
  Administered 2017-07-31: 4 mg via INTRAVENOUS

## 2017-07-31 MED ORDER — LIDOCAINE HCL (PF) 1 % IJ SOLN
INTRAMUSCULAR | Status: AC
  Filled 2017-07-31: qty 5

## 2017-07-31 MED ORDER — MIDAZOLAM HCL 5 MG/5ML IJ SOLN
INTRAMUSCULAR | Status: DC | PRN
  Administered 2017-07-31 (×2): 1 mg via INTRAVENOUS

## 2017-07-31 MED ORDER — MIDAZOLAM HCL 2 MG/2ML IJ SOLN
INTRAMUSCULAR | Status: AC
  Filled 2017-07-31: qty 2

## 2017-07-31 MED ORDER — LACTATED RINGERS IV SOLN
INTRAVENOUS | Status: DC | PRN
  Administered 2017-07-31: 08:00:00 via INTRAVENOUS

## 2017-07-31 MED ORDER — SODIUM CHLORIDE 0.9 % IV SOLN: Freq: Once | INTRAVENOUS | Status: DC

## 2017-07-31 MED ORDER — PROPOFOL 10 MG/ML IV BOLUS
INTRAVENOUS | Status: AC
  Filled 2017-07-31: qty 20

## 2017-07-31 MED ORDER — PROPOFOL 10 MG/ML IV BOLUS
INTRAVENOUS | Status: DC | PRN
  Administered 2017-07-31: 10 mg via INTRAVENOUS

## 2017-07-31 MED ORDER — PROPOFOL 500 MG/50ML IV EMUL
INTRAVENOUS | Status: DC | PRN
  Administered 2017-07-31: 50 ug/kg/min via INTRAVENOUS

## 2017-07-31 NOTE — Op Note (Signed)
[facility] Patient Name: Angela Oconnell Procedure Date: 07/31/2017 8:04 AM MRN: [id number] Date of Birth: 08/21/1935 Attending MD: Hildred Laser , MD CSN: [id number] Age: 82 Admit Type: Inpatient Procedure:                Colonoscopy Indications:              Melena, Acute post hemorrhagic anemia Providers:                Hildred Laser, MD, Jeralyn Bennett RN, RN, Lurline Del, RN Referring MD:             Murlean Iba, MD Medicines:                Propofol per Anesthesia Complications:            No immediate complications. Estimated Blood Loss:     Estimated blood loss was minimal. Procedure:                Pre-Anesthesia Assessment:                           - Prior to the procedure, a History and Physical                            was performed, and patient medications and                            allergies were reviewed. The patient's tolerance of                            previous anesthesia was also reviewed. The risks                            and benefits of the procedure and the sedation                            options and risks were discussed with the patient.                            All questions were answered, and informed consent                            was obtained. Prior Anticoagulants: The patient has                            taken no previous anticoagulant or antiplatelet                            agents. ASA Grade Assessment: III - A patient with                            severe systemic disease. After reviewing the risks  and benefits, the patient was deemed in                            satisfactory condition to undergo the procedure.                           After obtaining informed consent, the colonoscope                            was passed under direct vision. Throughout the                            procedure, the patient's blood pressure, pulse, and   oxygen saturations were monitored continuously. The                            EC-[id number] ([id number]) scope was introduced through                            the anus and advanced to the the cecum, identified                            by appendiceal orifice and ileocecal valve. The                            colonoscopy was performed without difficulty. The                            patient tolerated the procedure well. The quality                            of the bowel preparation was excellent. The                            ileocecal valve, appendiceal orifice, and rectum                            were photographed. Scope In: 8:23:33 AM Scope Out: 9:02:12 AM Scope Withdrawal Time: 0 hours 31 minutes 57 seconds  Total Procedure Duration: 0 hours 38 minutes 39 seconds  Findings:      The perianal examination was normal.      The digital rectal exam findings include decreased sphincter tone.      A small polyp was found in the cecum. The polyp was sessile. Biopsies       were taken with a cold forceps for histology. The pathology specimen was       placed into Bottle Number 1.      Three sessile polyps were found in the sigmoid colon and transverse       colon. The polyps were small in size. These were biopsied with a cold       forceps for histology. The pathology specimen was placed into Bottle       Number 2.      Scattered diverticula were found in the sigmoid colon.      The exam was otherwise without abnormality on direct and  retroflexion       views. Impression:               - Decreased sphincter tone found on digital rectal                            exam.                           - One small polyp in the cecum. Biopsied.                           - Three small polyps in the sigmoid colon and in                            the transverse colon. Biopsied.                           - Diverticulosis in the sigmoid colon.                           - The examination was  otherwise normal on direct                            and retroflexion views.                           Comment: Small bowel bleed vs ? colonic                            diverticular bleed. Moderate Sedation:      Per Anesthesia Care Recommendation:           - Return patient to hospital ward for ongoing care.                           - Cardiac diet today.                           - Continue present medications.                           - Await pathology results.                           - ? Given Capsule study.                           - The colonoscopy date will be determined after                            pathology results from today's exam become                            available for review. Procedure Code(s):        --- Professional ---  45380, Colonoscopy, flexible; with biopsy, single                            or multiple Diagnosis Code(s):        --- Professional ---                           K62.89, Other specified diseases of anus and rectum                           D12.0, Benign neoplasm of cecum                           D12.5, Benign neoplasm of sigmoid colon                           D12.3, Benign neoplasm of transverse colon (hepatic                            flexure or splenic flexure)                           K92.1, Melena (includes Hematochezia)                           D62, Acute posthemorrhagic anemia                           K57.30, Diverticulosis of large intestine without                            perforation or abscess without bleeding CPT copyright 2016 American Medical Association. All rights reserved. The codes documented in this report are preliminary and upon coder review may  be revised to meet current compliance requirements. Hildred Laser, MD Hildred Laser, MD 07/31/2017 9:18:26 AM This report has been signed electronically. Number of Addenda: 0

## 2017-07-31 NOTE — Progress Notes (Signed)
Brief colonoscopy note.  Examination performed to cecum. Small cecal polyp ablated via cold biopsy. 3 small polyps removed via cold biopsy and submitted together.  To a transverse colon and one in sigmoid colon. Sigmoid diverticulosis without stigmata of bleed.

## 2017-07-31 NOTE — Anesthesia Postprocedure Evaluation (Signed)
Anesthesia Post Note  Patient: Angela Oconnell  Procedure(s) Performed: COLONOSCOPY WITH PROPOFOL (N/A ) POLYPECTOMY  Patient location during evaluation: PACU Anesthesia Type: MAC Level of consciousness: awake and alert and oriented Pain management: pain level controlled Vital Signs Assessment: post-procedure vital signs reviewed and stable Respiratory status: spontaneous breathing Cardiovascular status: stable Postop Assessment: no apparent nausea or vomiting Anesthetic complications: no     Last Vitals:  Vitals:   07/31/17 0912 07/31/17 0915  BP: 103/79 115/63  Pulse: (!) 25 (!) 54  Resp: 20 20  Temp:  36.8 C  SpO2: 92% 93%    Last Pain:  Vitals:   07/31/17 0915  TempSrc:   PainSc: 0-No pain                 Mignonne Afonso

## 2017-07-31 NOTE — Progress Notes (Signed)
Patient still has not completed Golytely. RN keeps encouraging pt to continue to drink. Pt is very sleepy and exhausted from the frequent diarrhea.   Pt stated she wears 2L of oxygen at home while sleeping. Placed 2L on pt.   Margaret Pyle, RN

## 2017-07-31 NOTE — Transfer of Care (Signed)
Immediate Anesthesia Transfer of Care Note  Patient: Angela Oconnell  Procedure(s) Performed: COLONOSCOPY WITH PROPOFOL (N/A ) POLYPECTOMY  Patient Location: PACU  Anesthesia Type:MAC  Level of Consciousness: awake and alert   Airway & Oxygen Therapy: Patient Spontanous Breathing and Patient connected to nasal cannula oxygen  Post-op Assessment: Report given to RN  Post vital signs: Reviewed and stable  Last Vitals:  Vitals:   07/31/17 0646 07/31/17 0800  BP: (!) 120/59 (!) 116/59  Pulse: 64 (!) 58  Resp: 20 18  Temp: (!) 36.4 C 36.6 C  SpO2: 100% 96%    Last Pain:  Vitals:   07/31/17 0800  TempSrc: Oral  PainSc:       Patients Stated Pain Goal: 5 (34/96/11 6435)  Complications: No apparent anesthesia complications

## 2017-07-31 NOTE — Addendum Note (Signed)
Addendum  created 07/31/17 0930 by Ollen Bowl, CRNA   Sign clinical note

## 2017-07-31 NOTE — Progress Notes (Signed)
  Subjective:  Patient has no complaints other than feeling weak.  She denies nausea vomiting or abdominal pain.  According to nursing staff last time she had a bowel movement was black liquid but no bright red blood reported.  Patient does not take aspirin or other OTC NSAIDs.   Objective: Blood pressure (!) 120/59, pulse 64, temperature (!) 97.5 F (36.4 C), resp. rate 20, height 4\' 10"  (1.473 m), weight 120 lb (54.4 kg), SpO2 100 %. Slight drooping of right eyelid. Conjunctiva is pink. Sclera is nonicteric Oropharyngeal mucosa is normal. No neck masses or thyromegaly noted. Cardiac exam with regular rhythm normal S1 and S2. No murmur or gallop noted. Auscultation of lungs reveal diminished intensity of breath sounds bilaterally but no rales or rhonchi. Abdomen is symmetrical with long midline scar.  On palpation it soft and nontender without organomegaly or masses. Extremities are thin but no LE edema or clubbing noted.  Labs/studies Results:  Recent Labs    2017-08-24 0548  WBC 6.9  HGB 8.5*  HCT 26.8*  PLT 241    BMET  Recent Labs    2017/08/24 0548  NA 134*  K 3.6  CL 102  CO2 20*  GLUCOSE 96  BUN 27*  CREATININE 0.84  CALCIUM 8.7*    LFT  Recent Labs    24-Aug-2017 0548  PROT 5.9*  ALBUMIN 3.3*  AST 19  ALT 12*  ALKPHOS 55  BILITOT 0.8     Assessment:  #1.  Patient with melena and anemia who underwent EGD yesterday revealing low-grade esophageal stricture large hiatal hernia and Cameron lesions without stigmata of bleeding.  She could be losing blood from her proximal colon and/or small bowel. No history of NSAID use.  Colonoscopy reportedly normal 2 years ago.  #2.  Anemia secondary to GI bleed.  The globin 8.5 on admission.  Not done this morning.  #3.  COPD.  Patient has no dyspnea at rest.  Plan:  CBC this morning. Proceed with colonoscopy under monitored anesthesia care. Patient's questions answered.

## 2017-07-31 NOTE — Progress Notes (Addendum)
PROGRESS NOTE    Angela Oconnell  XNA:[id number]  DOB: 03-26-1936  DOA: 07/30/2017 PCP: Patient, No Pcp Per   Brief Admission Hx: Angela Oconnell  is a 82 y.o. female, with history of depression, hypertension, COPD who came to the hospital after she noticed black stool at home.   MDM/Assessment & Plan:   1. Melena-unclear etiology, EGD was unrevealing as cause of melena, I spoke with Dr. Laural Golden and colon survey revealed sigmoid diverticulosis and polyps removed with cold biopsy.  GI will discuss capsule study with patient later today.   2. Anemia-Transfuse 1 unit PRBCs.  3. Hyperlipidemia-continue Lipitor.  4. COPD - stable, continue supportive care and follow.  5. Depression-continue Klonopin, Prozac 6. Hypertension-blood pressure stable, continue propranolol  DVT Prophylaxis-   SCDs   AM Labs Ordered, also please review Full Orders  Family Communication: I spoke with the patient's son by telephone today.   Code Status:  DNR  Consultants:  GI  Procedures:  EGD   Colonoscopy   Subjective: Pt tolerated procedure well and reports that she is feeling well.  No complaints.    Objective: Vitals:   07/31/17 0915 07/31/17 0930 07/31/17 0940 07/31/17 1000  BP: 115/63 122/64 128/64 (!) 127/57  Pulse: (!) 54 61 (!) 55 (!) 58  Resp: 20 16 18 18   Temp: 98.2 F (36.8 C)   98.7 F (37.1 C)  TempSrc:    Oral  SpO2: 93% 99% 99% 100%  Weight:      Height:        Intake/Output Summary (Last 24 hours) at 07/31/2017 1058 Last data filed at 07/31/2017 0914 Gross per 24 hour  Intake 1869.17 ml  Output 400 ml  Net 1469.17 ml   Filed Weights   07/30/17 0328 07/30/17 1145  Weight: 54.7 kg (120 lb 9.5 oz) 54.4 kg (120 lb)   REVIEW OF SYSTEMS  As per history otherwise all reviewed and reported negative  Exam:  General exam: awake, alert, NAD, cooperative.  Respiratory system: Clear. No increased work of breathing. Cardiovascular system: S1 & S2 heard, RRR. No  JVD, murmurs, gallops, clicks or pedal edema. Gastrointestinal system: Abdomen is nondistended, soft and nontender. Normal bowel sounds heard. Central nervous system: Alert and oriented. No focal neurological deficits. Extremities: no CCE.  Data Reviewed: Basic Metabolic Panel: Recent Labs  Lab 07/30/17 0548  NA 134*  K 3.6  CL 102  CO2 20*  GLUCOSE 96  BUN 27*  CREATININE 0.84  CALCIUM 8.7*   Liver Function Tests: Recent Labs  Lab 07/30/17 0548  AST 19  ALT 12*  ALKPHOS 55  BILITOT 0.8  PROT 5.9*  ALBUMIN 3.3*   No results for input(s): LIPASE, AMYLASE in the last 168 hours. No results for input(s): AMMONIA in the last 168 hours. CBC: Recent Labs  Lab 07/30/17 0548 07/31/17 0808  WBC 6.9 5.6  HGB 8.5* 8.2*  HCT 26.8* 26.4*  MCV 90.2 92.3  PLT 241 232   Cardiac Enzymes: No results for input(s): CKTOTAL, CKMB, CKMBINDEX, TROPONINI in the last 168 hours. CBG (last 3)  No results for input(s): GLUCAP in the last 72 hours. No results found for this or any previous visit (from the past 240 hour(s)).   Studies: No results found.  Scheduled Meds: . atorvastatin  10 mg Oral Daily  . carisoprodol  350 mg Oral QID  . FLUoxetine  60 mg Oral Daily  . gabapentin  100 mg Oral TID  . pantoprazole  40 mg  Oral BID AC  . propranolol  40 mg Oral BID   Continuous Infusions: . sodium chloride    . 0.9 % NaCl with KCl 20 mEq / L 50 mL/hr at 07/30/17 1537    Active Problems:   Melena   HTN (hypertension)  Time spent:   Irwin Brakeman, MD, FAAFP [facility] Pager [phone]  If 7PM-7AM, please contact night-coverage [email or web address] Password [email or web address] 07/31/2017, 10:58 AM    LOS: 1 day

## 2017-07-31 NOTE — Anesthesia Postprocedure Evaluation (Signed)
Anesthesia Post Note  Patient: Angela Oconnell  Procedure(s) Performed: ESOPHAGOGASTRODUODENOSCOPY (EGD) WITH PROPOFOL (N/A )  Patient location during evaluation: PACU Anesthesia Type: MAC Level of consciousness: awake and alert and oriented Pain management: pain level controlled Vital Signs Assessment: post-procedure vital signs reviewed and stable Respiratory status: spontaneous breathing Cardiovascular status: stable : admitting diagnosis included nausea and vomiting which continues to bother her intermittently, none presently. Anesthetic complications: no     Last Vitals:  Vitals:   07/31/17 0912 07/31/17 0915  BP: 103/79 115/63  Pulse: (!) 25 (!) 54  Resp: 20 20  Temp:  36.8 C  SpO2: 92% 93%    Last Pain:  Vitals:   07/31/17 0915  TempSrc:   PainSc: 0-No pain                 Shantea Poulton

## 2017-07-31 NOTE — Anesthesia Preprocedure Evaluation (Addendum)
Anesthesia Evaluation  Patient identified by MRN, date of birth, ID band Patient awake    Reviewed: Allergy & Precautions, NPO status , Patient's Chart, lab work & pertinent test results, reviewed documented beta blocker date and time   Airway Mallampati: I  TM Distance: >3 FB     Dental  (+) Partial Upper, Teeth Intact   Pulmonary neg pulmonary ROS, COPD,    breath sounds clear to auscultation       Cardiovascular hypertension, Pt. on home beta blockers + DOE   Rhythm:Regular Rate:Normal     Neuro/Psych Anxiety Depression    GI/Hepatic GERD  Medicated and Controlled,  Endo/Other    Renal/GU      Musculoskeletal   Abdominal   Peds  Hematology  (+) anemia ,   Anesthesia Other Findings   Reproductive/Obstetrics                            Anesthesia Physical Anesthesia Plan  ASA: III and emergent  Anesthesia Plan: MAC   Post-op Pain Management:    Induction:   PONV Risk Score and Plan:   Airway Management Planned: Simple Face Mask  Additional Equipment:   Intra-op Plan:   Post-operative Plan:   Informed Consent:   Plan Discussed with: Surgeon  Anesthesia Plan Comments:        Anesthesia Quick Evaluation

## 2017-08-01 DIAGNOSIS — K921 Melena: Secondary | ICD-10-CM | POA: Diagnosis not present

## 2017-08-01 DIAGNOSIS — I10 Essential (primary) hypertension: Secondary | ICD-10-CM | POA: Diagnosis not present

## 2017-08-01 DIAGNOSIS — D123 Benign neoplasm of transverse colon: Secondary | ICD-10-CM | POA: Diagnosis not present

## 2017-08-01 DIAGNOSIS — K5730 Diverticulosis of large intestine without perforation or abscess without bleeding: Secondary | ICD-10-CM | POA: Diagnosis not present

## 2017-08-01 DIAGNOSIS — D62 Acute posthemorrhagic anemia: Secondary | ICD-10-CM | POA: Diagnosis not present

## 2017-08-01 DIAGNOSIS — D125 Benign neoplasm of sigmoid colon: Secondary | ICD-10-CM | POA: Diagnosis not present

## 2017-08-01 DIAGNOSIS — D120 Benign neoplasm of cecum: Secondary | ICD-10-CM | POA: Diagnosis not present

## 2017-08-01 LAB — BPAM RBC
Blood Product Expiration Date: 201904042359
ISSUE DATE / TIME: 201903091147
Unit Type and Rh: 5100

## 2017-08-01 LAB — TYPE AND SCREEN
ABO/RH(D): O POS
Antibody Screen: NEGATIVE
Unit division: 0

## 2017-08-01 LAB — HEMOGLOBIN AND HEMATOCRIT, BLOOD
HCT: 34.3 % — ABNORMAL LOW (ref 36.0–46.0)
Hemoglobin: 10.8 g/dL — ABNORMAL LOW (ref 12.0–15.0)

## 2017-08-01 NOTE — Addendum Note (Signed)
Addendum  created 08/01/17 1227 by Ollen Bowl, CRNA   Sign clinical note

## 2017-08-01 NOTE — Discharge Summary (Signed)
Physician Discharge Summary  Angela Oconnell FGH:[id number] DOB: [birth year] DOA: 07/30/2017  PCP: [facility]  Angela Oconnell  Admit date: 07/30/2017 Discharge date: 08/01/2017  Admitted From: Home  Disposition: Home   Recommendations for Outpatient Follow-up:  1. Follow up with PCP in 1 weeks 2. Follow up with Angela Oconnell in 2 weeks 3. Follow up with neurology in 2 weeks 4. Please obtain BMP/CBC in one week  Home Health: RN, PT, SW  Discharge Condition: STABLE   CODE STATUS: DNR    Brief Hospitalization Summary: Please see all hospital notes, images, labs for full details of the hospitalization. HPI:   Angela Oconnell  is a 82 y.o. female, with history of depression, hypertension, COPD who came to the hospital after she noticed black stool at home.  Patient says that she got steroid shot in her hip for osteoarthritis and she has been getting this once a month over the past 6 months.  After she got the shot she noticed black colored stool and abdominal pain.  She denies nausea, Vomiting or diarrhea.  Denies vomiting any blood.  No bright red blood per rectum No chest pain,  but complains of shortness of breath.  Also complains of cough  She denies taking NSAIDs.  Guaiac was positive in the ED at [facility]  No previous history of gastric ulcer.  She had a EGD and colonoscopy done at [facility] 2 years ago which did not show significant abnormality.    Admission Hx: PeggyBarkeris a81 y.o.female,with history of depression, hypertension, COPD who came to the hospital after she noticed black stool at home.   MDM/Assessment & Plan:   1. Melena-unclear etiology, EGD was unrevealing as cause of melena, I spoke with Angela Oconnell and colon survey revealed sigmoid diverticulosis and polyps removed with cold biopsy.  The diverticulosis could be the source of her bleeding.  Angela Oconnell will discuss capsule study with patient if she has any further bleeding. Her hemoglobin has  improved to 10.8.  She is stable for discharge and cleared by Angela Oconnell team.   2. Anemia-s/p 1 unit PRBCs on admission.  3. Hyperlipidemia-continue Lipitor.  4. COPD - stable, continue supportive care and follow.  5. Depression-continue Klonopin, Prozac 6. Hypertension-blood pressure stable, continue propranolol  DVT Prophylaxis- SCDs   AM Labs Ordered, also please review Full Orders  Family Communication:I spoke with the patient's son by telephone today. He was interested in SNF rehab.  I asked the social worker to speak with him. I was informed that patient could not be placed at this time as no PT at hospital today and patient under observation status and has not had a qualifying 3 night stay.  I consulted the care manager to arrange home health services for PT, RN, SW so that placement could be pursued outpatient.    Code Status:DNR  Consultants:  Angela Oconnell  Procedures:  EGD   Colonoscopy   Discharge Diagnoses:  Active Problems:   Melena   HTN (hypertension)  Discharge Instructions:  Allergies as of 08/01/2017      Reactions   Motrin [ibuprofen] Other (See Comments)   Bladder spasm   Sulfur Nausea And Vomiting      Medication List    TAKE these medications   atorvastatin 10 MG tablet Commonly known as:  LIPITOR Take 10 mg by mouth daily.   carisoprodol 350 MG tablet Commonly known as:  SOMA Take 350 mg by mouth 4 (four) times daily.   clonazePAM 0.5 MG tablet Commonly  known as:  KLONOPIN Take 0.5 mg by mouth 2 (two) times daily as needed for anxiety.   FLUoxetine 20 MG tablet Commonly known as:  PROZAC Take 60 mg by mouth daily.   gabapentin 300 MG capsule Commonly known as:  NEURONTIN Take 100 mg by mouth 3 (three) times daily.   pantoprazole 40 MG tablet Commonly known as:  PROTONIX Take 40 mg by mouth daily.   propranolol 80 MG tablet Commonly known as:  INDERAL Take 40 mg by mouth 2 (two) times daily.      Follow-up Information     Primary Care Physician. Schedule an appointment as soon as possible for a visit in 1 week(s).   Why:  Hospital Follow Up        Angela Oconnell, Angela Melnick, MD. Schedule an appointment as soon as possible for a visit in 2 week(s).   Specialty:  Gastroenterology Why:  Hospital Follow Up:  Discuss Capsule Study Contact information: [address] [place] [address] [phone]        Angela Odor, MD. Schedule an appointment as soon as possible for a visit in 2 week(s).   Specialty:  Neurology Why:  Establish care for memory testing.   Contact information: [address] [place] [address] [phone]          Allergies  Allergen Reactions  . Motrin [Ibuprofen] Other (See Comments)    Bladder spasm  . Sulfur Nausea And Vomiting   Allergies as of 08/01/2017      Reactions   Motrin [ibuprofen] Other (See Comments)   Bladder spasm   Sulfur Nausea And Vomiting      Medication List    TAKE these medications   atorvastatin 10 MG tablet Commonly known as:  LIPITOR Take 10 mg by mouth daily.   carisoprodol 350 MG tablet Commonly known as:  SOMA Take 350 mg by mouth 4 (four) times daily.   clonazePAM 0.5 MG tablet Commonly known as:  KLONOPIN Take 0.5 mg by mouth 2 (two) times daily as needed for anxiety.   FLUoxetine 20 MG tablet Commonly known as:  PROZAC Take 60 mg by mouth daily.   gabapentin 300 MG capsule Commonly known as:  NEURONTIN Take 100 mg by mouth 3 (three) times daily.   pantoprazole 40 MG tablet Commonly known as:  PROTONIX Take 40 mg by mouth daily.   propranolol 80 MG tablet Commonly known as:  INDERAL Take 40 mg by mouth 2 (two) times daily.      Procedures/Studies:  No results found.   Subjective: Pt has had no further bleeding and Hg is improved.  Pt says she wants to go home, says that son is home with her 24/7 for assistance  Discharge Exam: Vitals:   07/31/17 1519 07/31/17 2135  BP: (!) 98/55 111/67  Pulse: 60 60   Resp: 20 18  Temp: 98 F (36.7 C) 98.4 F (36.9 C)  SpO2: 100% 96%   Vitals:   07/31/17 1211 07/31/17 1300 07/31/17 1519 07/31/17 2135  BP: (!) 115/54 (!) 115/57 (!) 98/55 111/67  Pulse: 62 64 60 60  Resp: 20  20 18   Temp: 98.9 F (37.2 C) 98.7 F (37.1 C) 98 F (36.7 C) 98.4 F (36.9 C)  TempSrc: Oral Oral Oral Oral  SpO2: 100% 99% 100% 96%  Weight:      Height:       General: Pt is alert, awake, not in acute distress Cardiovascular: RRR, S1/S2 +, no rubs, no  gallops Respiratory: CTA bilaterally, no wheezing, no rhonchi Abdominal: Soft, NT, ND, bowel sounds + Extremities: no edema, no cyanosis   The results of significant diagnostics from this hospitalization (including imaging, microbiology, ancillary and laboratory) are listed below for reference.    Microbiology: No results found for this or any previous visit (from the past 240 hour(s)).   Labs: BNP (last 3 results) No results for input(s): BNP in the last 8760 hours. Basic Metabolic Panel: Recent Labs  Lab 07/30/17 0548  NA 134*  K 3.6  CL 102  CO2 20*  GLUCOSE 96  BUN 27*  CREATININE 0.84  CALCIUM 8.7*   Liver Function Tests: Recent Labs  Lab 07/30/17 0548  AST 19  ALT 12*  ALKPHOS 55  BILITOT 0.8  PROT 5.9*  ALBUMIN 3.3*   No results for input(s): LIPASE, AMYLASE in the last 168 hours. No results for input(s): AMMONIA in the last 168 hours. CBC: Recent Labs  Lab 07/30/17 0548 07/31/17 0808 07/31/17 1756 08/01/17 0936  WBC 6.9 5.6  --   --   HGB 8.5* 8.2* 9.3* 10.8*  HCT 26.8* 26.4* 29.1* 34.3*  MCV 90.2 92.3  --   --   PLT 241 232  --   --    Cardiac Enzymes: No results for input(s): CKTOTAL, CKMB, CKMBINDEX, TROPONINI in the last 168 hours. BNP: Invalid input(s): POCBNP CBG: No results for input(s): GLUCAP in the last 168 hours. D-Dimer No results for input(s): DDIMER in the last 72 hours. Hgb A1c No results for input(s): HGBA1C in the last 72 hours. Lipid Profile No  results for input(s): CHOL, HDL, LDLCALC, TRIG, CHOLHDL, LDLDIRECT in the last 72 hours. Thyroid function studies No results for input(s): TSH, T4TOTAL, T3FREE, THYROIDAB in the last 72 hours.  Invalid input(s): FREET3 Anemia work up No results for input(s): VITAMINB12, FOLATE, FERRITIN, TIBC, IRON, RETICCTPCT in the last 72 hours. Urinalysis No results found for: COLORURINE, APPEARANCEUR, LABSPEC, [place], GLUCOSEU, HGBUR, BILIRUBINUR, KETONESUR, PROTEINUR, UROBILINOGEN, NITRITE, LEUKOCYTESUR Sepsis Labs Invalid input(s): PROCALCITONIN,  WBC,  LACTICIDVEN Microbiology No results found for this or any previous visit (from the past 240 hour(s)).  Time coordinating discharge:   SIGNED:  Irwin Brakeman, MD  [facility] 08/01/2017, 3:00 PM Pager [phone]  If 7PM-7AM, please contact night-coverage [email or web address] Password [email or web address]

## 2017-08-01 NOTE — Care Management Note (Signed)
Case Management Note  Patient Details  Name: Angela Oconnell MRN: [id number] Date of Birth: Jan 23, 1936  Subjective/Objective:      Pt presented for GI Bleed from home.  Pt and family wanted pt to d/c to SNF, but unable to place in SNF due to observation stay.  Pt and family agree to return home with [facility] services.   Spoke with pt's son in pt's room via telephone and offered [facility] choice.  Pt asked for [facility], who provides her oxygen.           Action/Plan: No answer at [facility]'s number.  Malachy Mood with [facility] accepted referral with [facility] Tuesday.  No DME needed.    Expected Discharge Date:  08/01/17               Expected Discharge Plan:  [place]  In-House Referral:  NA  Discharge planning Services  CM Consult  Post Acute Care Choice:  Home Health Choice offered to:  Patient, Adult Children  DME Arranged:  N/A DME Agency:  NA  HH Arranged:  RN, PT, Social Work [employer] Agency:  [employer]  Status of Service:  Completed, signed off  If discussed at [employer], dates discussed:    Additional Comments:  Claudie Leach, RN 08/01/2017, 3:22 PM

## 2017-08-01 NOTE — Progress Notes (Signed)
Patient and family state understanding of discharge instructions 

## 2017-08-01 NOTE — Anesthesia Postprocedure Evaluation (Signed)
Anesthesia Post Note  Patient: Coley Littles  Procedure(s) Performed: COLONOSCOPY WITH PROPOFOL (N/A ) POLYPECTOMY  Patient location during evaluation: PACU Anesthesia Type: MAC Level of consciousness: awake and alert and oriented Pain management: pain level controlled Vital Signs Assessment: post-procedure vital signs reviewed and stable Respiratory status: spontaneous breathing Cardiovascular status: stable and blood pressure returned to baseline Postop Assessment: no apparent nausea or vomiting Anesthetic complications: no     Last Vitals:  Vitals:   07/31/17 1519 07/31/17 2135  BP: (!) 98/55 111/67  Pulse: 60 60  Resp: 20 18  Temp: 36.7 C 36.9 C  SpO2: 100% 96%    Last Pain:  Vitals:   08/01/17 0301  TempSrc:   PainSc: Asleep                 Prima Rayner

## 2017-08-01 NOTE — Progress Notes (Signed)
  Subjective:  Patient has no complaints.  She denies abdominal pain.  She had a bowel movement earlier today and there was no blood or black stool.  She states her breathing is at baseline.     Objective: Blood pressure 111/67, pulse 60, temperature 98.4 F (36.9 C), temperature source Oral, resp. rate 18, height 4\' 10"  (1.473 m), weight 120 lb (54.4 kg), SpO2 96 %. Patient is alert and in no acute distress. Increased chest AP diameter. Abdomen is symmetrical soft and nontender. Extremities are thin without edema.  Labs/studies Results:  Recent Labs    08-29-2017 0548 07/31/17 0808 07/31/17 1756 08/01/17 0936  WBC 6.9 5.6  --   --   HGB 8.5* 8.2* 9.3* 10.8*  HCT 26.8* 26.4* 29.1* 34.3*  PLT 241 232  --   --     BMET  Recent Labs    29-Aug-2017 0548  NA 134*  K 3.6  CL 102  CO2 20*  GLUCOSE 96  BUN 27*  CREATININE 0.84  CALCIUM 8.7*    LFT  Recent Labs    2017/08/29 0548  PROT 5.9*  ALBUMIN 3.3*  AST 19  ALT 12*  ALKPHOS 55  BILITOT 0.8      Assessment:  #1.  GI bleed.  Patient underwent EGD 2 days ago revealing large hiatal hernia with early [place] lesions and gastritis but no bleeding lesion identified.  She underwent colonoscopy yesterday revealing sigmoid colon diverticulosis and 4 small polyps which were removed.  She has not bled anymore.  She could have bled from small bowel or from colonic diverticulosis all the presentation somewhat atypical.  Patient desires no further workup unless bleeding recurs.  #2.  Anemia secondary to GI bleed.  Patient has received 1 unit of PRBCs.  H&H from this morning is pending.  #3.  COPD.  Recommendations:  If H&H is stable patient can be discharged. Advised not to take NSAIDs. If bleeding recurswill proceed with small bowel given capsule study. I will be contacting patient with biopsy results next week.

## 2017-08-01 NOTE — Discharge Instructions (Signed)
Please follow-up with your primary care provider within a week to have your CBC retested. Please follow-up with your gastroenterologist in 2-4 weeks to discuss further testing and if you need to pursue a capsule study. Seek medical care or return if you develop any signs of bleeding or black stools or have severe fatigue, shortness of breath, chest pain or other serious symptoms. Please make appointment with neurologist Dr. Merlene Laughter to start memory testing and evaluate for dementia.     Follow with Primary MD  Patient, No Pcp Per  and other consultants as instructed your Hospitalist MD  Please get a complete blood count and chemistry panel checked by your Primary MD at your next visit, and again as instructed by your Primary MD.  Get Medicines reviewed and adjusted: Please take all your medications with you for your next visit with your Primary MD  Laboratory/radiological data: Please request your Primary MD to go over all hospital tests and procedure/radiological results at the follow up, please ask your Primary MD to get all Hospital records sent to his/her office.  In some cases, they will be blood work, cultures and biopsy results pending at the time of your discharge. Please request that your primary care M.D. follows up on these results.  Also Note the following: If you experience worsening of your admission symptoms, develop shortness of breath, life threatening emergency, suicidal or homicidal thoughts you must seek medical attention immediately by calling 911 or calling your MD immediately  if symptoms less severe.  You must read complete instructions/literature along with all the possible adverse reactions/side effects for all the Medicines you take and that have been prescribed to you. Take any new Medicines after you have completely understood and accpet all the possible adverse reactions/side effects.   Do not drive when taking Pain medications or sleeping medications  (Benzodaizepines)  Do not take more than prescribed Pain, Sleep and Anxiety Medications. It is not advisable to combine anxiety,sleep and pain medications without talking with your primary care practitioner  Special Instructions: If you have smoked or chewed Tobacco  in the last 2 yrs please stop smoking, stop any regular Alcohol  and or any Recreational drug use.  Wear Seat belts while driving.  Please note: You were cared for by a hospitalist during your hospital stay. Once you are discharged, your primary care physician will handle any further medical issues. Please note that NO REFILLS for any discharge medications will be authorized once you are discharged, as it is imperative that you return to your primary care physician (or establish a relationship with a primary care physician if you do not have one) for your post hospital discharge needs so that they can reassess your need for medications and monitor your lab values.

## 2017-08-02 ENCOUNTER — Encounter: Payer: Self-pay | Admitting: Gastroenterology

## 2017-08-02 ENCOUNTER — Telehealth: Payer: Self-pay | Admitting: Gastroenterology

## 2017-08-02 NOTE — Telephone Encounter (Signed)
Tried to call pt, no answer, unable to leave message d/t voicemail box full.

## 2017-08-02 NOTE — Telephone Encounter (Signed)
Please arrange hospital follow-up in 4-6 weeks. Was inpatient for GI bleed.

## 2017-08-02 NOTE — Telephone Encounter (Addendum)
Please call pt. HER stomach Bx shows gastritis.  SHE SHOULD HAVE AN AGILE STUDY FOLLOWED BY [facility]UD[facility] WITHIN THE NEXT 7 DAYS, DX: OBSCURE/OVERt GI BLEED/MELENA.

## 2017-08-02 NOTE — Telephone Encounter (Signed)
Tried calling pt, mailbox is full °

## 2017-08-02 NOTE — Telephone Encounter (Signed)
PATIENT SCHEDULED AND LETTER SENT  °

## 2017-08-03 NOTE — Telephone Encounter (Signed)
Tried to call, mailbox full. Mailing a letter to call.

## 2017-08-04 ENCOUNTER — Encounter (HOSPITAL_COMMUNITY): Payer: Self-pay | Admitting: Gastroenterology

## 2017-09-24 ENCOUNTER — Encounter: Payer: Self-pay | Admitting: Gastroenterology

## 2017-09-24 ENCOUNTER — Ambulatory Visit (INDEPENDENT_AMBULATORY_CARE_PROVIDER_SITE_OTHER): Payer: Medicare Other | Admitting: Gastroenterology

## 2017-09-24 ENCOUNTER — Encounter

## 2017-09-24 VITALS — BP 97/66 | HR 59 | Temp 97.0°F | Ht <= 58 in | Wt 119.8 lb

## 2017-09-24 DIAGNOSIS — R197 Diarrhea, unspecified: Secondary | ICD-10-CM

## 2017-09-24 DIAGNOSIS — R5383 Other fatigue: Secondary | ICD-10-CM | POA: Diagnosis not present

## 2017-09-24 LAB — COMPLETE METABOLIC PANEL WITH GFR
AG Ratio: 1.7 (calc) (ref 1.0–2.5)
ALT: 7 U/L (ref 6–29)
AST: 15 U/L (ref 10–35)
Albumin: 3.9 g/dL (ref 3.6–5.1)
Alkaline phosphatase (APISO): 77 U/L (ref 33–130)
BUN/Creatinine Ratio: 13 (calc) (ref 6–22)
BUN: 13 mg/dL (ref 7–25)
CALCIUM: 9.1 mg/dL (ref 8.6–10.4)
CO2: 30 mmol/L (ref 20–32)
CREATININE: 0.97 mg/dL — AB (ref 0.60–0.93)
Chloride: 100 mmol/L (ref 98–110)
GFR, EST AFRICAN AMERICAN: 64 mL/min/{1.73_m2} (ref >=60)
GFR, EST NON AFRICAN AMERICAN: 56 mL/min/{1.73_m2} — AB (ref >=60)
GLOBULIN: 2.3 g/dL (ref 1.9–3.7)
Glucose, Bld: 93 mg/dL (ref 65–139)
Potassium: 4.6 mmol/L (ref 3.5–5.3)
Sodium: 136 mmol/L (ref 135–146)
TOTAL PROTEIN: 6.2 g/dL (ref 6.1–8.1)
Total Bilirubin: 0.5 mg/dL (ref 0.2–1.2)

## 2017-09-24 LAB — CBC WITH DIFFERENTIAL/PLATELET
BASOS PCT: 0.6 %
Basophils Absolute: 31 cells/uL (ref 0–200)
EOS PCT: 2.9 %
Eosinophils Absolute: 148 cells/uL (ref 15–500)
HEMATOCRIT: 34.1 % — AB (ref 35.0–45.0)
Hemoglobin: 10.9 g/dL — ABNORMAL LOW (ref 11.7–15.5)
LYMPHS ABS: 1816 {cells}/uL (ref 850–3900)
MCH: 29 pg (ref 27.0–33.0)
MCHC: 32 g/dL (ref 32.0–36.0)
MCV: 90.7 fL (ref 80.0–100.0)
MPV: 10.9 fL (ref 7.5–12.5)
Monocytes Relative: 9.4 %
NEUTROS PCT: 51.5 %
Neutro Abs: 2627 cells/uL (ref 1500–7800)
Platelets: 205 10*3/uL (ref 140–400)
RBC: 3.76 10*6/uL — AB (ref 3.80–5.10)
RDW: 14.5 % (ref 11.0–15.0)
Total Lymphocyte: 35.6 %
WBC: 5.1 10*3/uL (ref 3.8–10.8)
WBCMIX: 479 {cells}/uL (ref 200–950)

## 2017-09-24 NOTE — Progress Notes (Signed)
Referring Provider: No ref. provider found Primary Care Physician:  Patient, No Pcp Per  Primary GI: Dr. Oneida Alar   Chief Complaint  Patient presents with  . GI Bleeding    c/o diarrhea QOD, 'can't control it"    HPI:   Angela Oconnell is a 82 y.o. female presenting today with a history of GI bleed inpatient [facility] March 2019 with melena and Hgb in 8 range. Received 1 unit PRBCs with d/c Hgb 10.8. EGD with large hiatal hernia, early cameron's lesions, gastritis, colonoscopy with simple adenomas and diverticulosis. Differentials small bowel source vs diverticular. Recommended agile capsule followed by givens as outpatient. We were unable to reach patient to accomplish this. Here for follow-up.   Having diarrhea every other day. If eats something soft, usually won't mess up her stomach. Last night couldn't control diarrhea. Has multiple watery stools. Feels weak as a "kitten". No overt GI bleeding since the hospital. No recent blood work. Sometimes doesn't eat. Hasn't eaten today because she was worried about diarrhea.  Denies foul odor to stool. Was prescribed abx recently for UTI. Diarrhea is chronic but "worse as it gets older".   Was prescribed Viberzi in [place] by a GI practice there prior to hospitalization. Taking it BID. Not significantly helping. Weight stable from March 2019. No fever or chills. No abdominal pain. No CT scans.   BP 111/67 at time of discharge. Today 97/66. Supine: 100/80, sitting up 98/72. CHRONIC symptoms of unsteady on feet, difficulty ambulating. Son, Nicole Kindred present with her. #: [phone].   Past Medical History:  Diagnosis Date  . Anxiety   . COPD (chronic obstructive pulmonary disease) ([place])   . Depression   . Dyspnea   . GERD (gastroesophageal reflux disease)   . Hypertension     Past Surgical History:  Procedure Laterality Date  . ABDOMINAL HYSTERECTOMY    . BACK SURGERY    . cataract surgery Bilateral   . COLONOSCOPY    . COLONOSCOPY  WITH PROPOFOL N/A 07/31/2017   simple adenomas, sigmoid diverticulosis. ?diverticular vs small bowel bleed  . CYSTECTOMY Left    Left Arm  . ESOPHAGOGASTRODUODENOSCOPY    . ESOPHAGOGASTRODUODENOSCOPY (EGD) WITH PROPOFOL N/A 07/30/2017   Dr. Oneida Alar: one moderate benign-appearing intrinsic stenosis traversed, large hiatal hernia, early Cameron's lesions, gastritis. Recommending agile followed by givens due to GI bleed  . POLYPECTOMY  07/31/2017   Procedure: POLYPECTOMY;  Surgeon: Rogene Houston, MD;  Location: [facility];  Service: Gastroenterology;;  cecal,transverse colon x2, sigmoid  . VEIN SURGERY      Current Outpatient Medications  Medication Sig Dispense Refill  . atorvastatin (LIPITOR) 10 MG tablet Take 10 mg by mouth daily.    . carisoprodol (SOMA) 350 MG tablet Take 350 mg by mouth at bedtime as needed.     . clonazePAM (KLONOPIN) 0.5 MG tablet Take 0.5 mg by mouth 2 (two) times daily as needed for anxiety.    . Eluxadoline (VIBERZI) 100 MG TABS Take 1 tablet by mouth 2 (two) times daily.    Marland Kitchen FLUoxetine (PROZAC) 20 MG tablet Take 60 mg by mouth daily.    Marland Kitchen gabapentin (NEURONTIN) 300 MG capsule Take 100 mg by mouth 3 (three) times daily.    . pantoprazole (PROTONIX) 40 MG tablet Take 40 mg by mouth daily.    . propranolol (INDERAL) 80 MG tablet Take 80 mg by mouth daily.      No current facility-administered medications for this visit.  Allergies as of 09/24/2017 - Review Complete 09/24/2017  Allergen Reaction Noted  . Motrin [ibuprofen] Other (See Comments) 07/30/2017  . Sulfur Nausea And Vomiting 07/30/2017    Family History  Problem Relation Age of Onset  . Colon cancer Neg Hx   . Colon polyps Neg Hx     Social History   Socioeconomic History  . Marital status: Unknown    Spouse name: Not on file  . Number of children: Not on file  . Years of education: Not on file  . Highest education level: Not on file  Occupational History  . Not on file  Social Needs    . Financial resource strain: Not on file  . Food insecurity:    Worry: Not on file    Inability: Not on file  . Transportation needs:    Medical: Not on file    Non-medical: Not on file  Tobacco Use  . Smoking status: Former Smoker    Years: 60.00  . Smokeless tobacco: Never Used  . Tobacco comment: used to smoke for 60 years, no longer smoking  Substance and Sexual Activity  . Alcohol use: No    Frequency: Never  . Drug use: No  . Sexual activity: Never  Lifestyle  . Physical activity:    Days per week: Not on file    Minutes per session: Not on file  . Stress: Not on file  Relationships  . Social connections:    Talks on phone: Not on file    Gets together: Not on file    Attends religious service: Not on file    Active member of club or organization: Not on file    Attends meetings of clubs or organizations: Not on file    Relationship status: Not on file  Other Topics Concern  . Not on file  Social History Narrative  . Not on file    Review of Systems: As mentioned in HPI   Physical Exam: BP 97/66   Pulse (!) 59   Temp (!) 97 F (36.1 C) (Oral)   Ht 4\' 10"  (1.473 m)   Wt 119 lb 12.8 oz (54.3 kg)   BMI 25.04 kg/m   Supine BP: 100/80, sitting BP: 98/72 General:   Alert and oriented. Appears fatigued. Frail. Face thin.  Head:  Normocephalic and atraumatic. Eyes:  Conjuctiva clear without scleral icterus. Mouth:  Oral mucosa pink and moist.  Cardiac:S1 S2 present Lungs: diminished bases, clear bilaterally Abdomen:  +BS, soft, non-tender and non-distended. No rebound or guarding. No HSM or masses noted. Msk:  With kyphosis, swaying when standing up and requires slight assistance with ambulating. Able to get on exam table  Extremities:  Without edema. Neurologic:  Alert and  oriented x4 Psych:  Alert and cooperative. Normal mood and affect.

## 2017-09-24 NOTE — Assessment & Plan Note (Signed)
82 year old female with history of chronic diarrhea, here today for hospital follow-up after GI bleed. Query GI bleed small bowel vs diverticular in origin. Ultimately needs agile study followed by givens but priority today is acute on chronic diarrhea, need to rule out CDI as she was exposed to antibiotics recently. I wonder if she has underlying pancreatic insufficiency (history of chronic smoking, notes chronic "greasy" stools, postprandial component, etc). She feels fatigued today and appears quite frail. Son present with her. I discussed risks and benefits of outpatient management vs ED presentation due to her constellation of symptoms. She is adamantly denying any ED presentation currently despite her significant fatigue. Because of her desire to pursue outpatient evaluation, we will start with stat CBC, CMP today, stool studies, and seek emergent evaluation if any evidence of anemia, dehydration, infection, etc. If stool studies negative, labs unrevealing, I would like for her to trial pancreatic enzymes. Son lives beside her and is able to watch her closely. We discussed signs/symptoms that would necessitate urgent evaluation. She is adamantly denying any ED presentation currently despite her significant fatigue. Will pursue agile capsule at a later date after acute issues addressed. She is likely dehydrated, unable to rule out worsening anemia (as labs have not been updated since discharge). Will call Nicole Kindred, her son, at [phone] when stat labs available.

## 2017-09-24 NOTE — Patient Instructions (Signed)
Please to go the lab across from the hospital and have blood work done right away.  I have given orders for stool studies. We want to rule out an infectious process. I feel that your chronic diarrhea MIGHT be related to pancreatic insufficiency. There is medication we can try for that.  I recommend ED evaluation if you have worsening fatigue, weakness, any signs of bleeding, etc.   We will call with results as soon as available.  It was a pleasure to see you today. I strive to create trusting relationships with patients to provide genuine, compassionate, and quality care. I value your feedback. If you receive a survey regarding your visit,  I greatly appreciate you taking time to fill this out.   Annitta Needs, PhD, ANP-BC [facility]

## 2017-09-24 NOTE — Progress Notes (Signed)
Attempted to call son and patient. Very mildly elevated creatinine according to laboratory range of "normal". Electrolytes good. Hgb stable. Awaiting stool studies.

## 2017-09-27 NOTE — Progress Notes (Signed)
Sending FYI to Roseanne Kaufman, NP.

## 2017-09-27 NOTE — Progress Notes (Signed)
No pcp per patient 

## 2017-09-27 NOTE — Progress Notes (Signed)
Thanks! Let's make sure she has a follow-up in 4-6 weeks, may use urgent, to tie up loose ends. Still needs agile capsule, which we can arrange now if she feels able to do so. NO GIVENS until patency agile is done. SLF patient.

## 2017-09-27 NOTE — Progress Notes (Signed)
Called to speak to Angela Oconnell. Pt answered and wanted to know results and I told her. She will have Nicole Kindred call if he wants to know. Said she has not done stool studies yet and not having any diarrhea.

## 2017-09-28 NOTE — Progress Notes (Signed)
LMOM to call.

## 2017-10-04 ENCOUNTER — Other Ambulatory Visit: Payer: Self-pay

## 2017-10-04 DIAGNOSIS — K922 Gastrointestinal hemorrhage, unspecified: Secondary | ICD-10-CM

## 2017-10-04 DIAGNOSIS — K921 Melena: Secondary | ICD-10-CM

## 2017-10-04 NOTE — Progress Notes (Signed)
Pt is aware. Ok to schedule. Forwarding to [place] and to [facility].

## 2017-10-05 ENCOUNTER — Encounter: Payer: Self-pay | Admitting: Gastroenterology

## 2017-10-05 LAB — STOOL CULTURE
MICRO NUMBER: 90577086
MICRO NUMBER:: 90577084
MICRO NUMBER:: 90577085
SHIGA RESULT:: NOT DETECTED
SPECIMEN QUALITY: ADEQUATE
SPECIMEN QUALITY: ADEQUATE
SPECIMEN QUALITY:: ADEQUATE

## 2017-10-05 LAB — C. DIFFICILE GDH AND TOXIN A/B
GDH ANTIGEN: NOT DETECTED
MICRO NUMBER:: 90574305
SPECIMEN QUALITY: ADEQUATE
TOXIN A AND B: NOT DETECTED

## 2017-10-05 NOTE — Progress Notes (Signed)
PATIENT SCHEDULED AND LETTER SENT  °

## 2017-10-05 NOTE — Progress Notes (Signed)
Called and mail box full. Mailing a letter of results.

## 2017-10-05 NOTE — Progress Notes (Signed)
Stool studies all negative.

## 2017-10-21 ENCOUNTER — Encounter (HOSPITAL_COMMUNITY): Payer: Self-pay | Admitting: *Deleted

## 2017-10-21 ENCOUNTER — Encounter (HOSPITAL_COMMUNITY)
Admission: RE | Disposition: A | Discharge status: Home / Self Care | Payer: Self-pay | Source: Ambulatory Visit | Attending: Gastroenterology

## 2017-10-21 ENCOUNTER — Ambulatory Visit (HOSPITAL_COMMUNITY)
Admission: RE | Admit: 2017-10-21 | Discharge: 2017-10-21 | Disposition: A | Discharge status: Home / Self Care | Payer: Medicare Other | Source: Ambulatory Visit | Attending: Gastroenterology | Admitting: Gastroenterology

## 2017-10-21 DIAGNOSIS — K921 Melena: Secondary | ICD-10-CM

## 2017-10-21 DIAGNOSIS — K922 Gastrointestinal hemorrhage, unspecified: Secondary | ICD-10-CM | POA: Insufficient documentation

## 2017-10-21 HISTORY — PX: AGILE CAPSULE: SHX5420

## 2017-10-21 SURGERY — AGILE CAPSULE

## 2017-10-21 MED ORDER — SODIUM CHLORIDE 0.9 % IV SOLN: INTRAVENOUS | Status: DC

## 2017-10-22 ENCOUNTER — Ambulatory Visit (HOSPITAL_COMMUNITY)
Admission: RE | Admit: 2017-10-22 | Discharge: 2017-10-22 | Disposition: A | Discharge status: Home / Self Care | Payer: Medicare Other | Source: Ambulatory Visit | Attending: Gastroenterology | Admitting: Gastroenterology

## 2017-10-22 DIAGNOSIS — K921 Melena: Secondary | ICD-10-CM | POA: Insufficient documentation

## 2017-10-22 DIAGNOSIS — K922 Gastrointestinal hemorrhage, unspecified: Secondary | ICD-10-CM | POA: Insufficient documentation

## 2017-10-22 DIAGNOSIS — Z9889 Other specified postprocedural states: Secondary | ICD-10-CM | POA: Diagnosis not present

## 2017-10-25 ENCOUNTER — Encounter (HOSPITAL_COMMUNITY): Payer: Self-pay | Admitting: Gastroenterology

## 2017-11-02 ENCOUNTER — Other Ambulatory Visit: Payer: Self-pay

## 2017-11-02 DIAGNOSIS — K922 Gastrointestinal hemorrhage, unspecified: Secondary | ICD-10-CM

## 2017-11-02 DIAGNOSIS — K921 Melena: Secondary | ICD-10-CM

## 2017-11-08 ENCOUNTER — Ambulatory Visit (HOSPITAL_COMMUNITY)
Admission: RE | Admit: 2017-11-08 | Discharge: 2017-11-08 | Disposition: A | Discharge status: Home / Self Care | Payer: Medicare Other | Source: Ambulatory Visit | Attending: Gastroenterology | Admitting: Gastroenterology

## 2017-11-08 ENCOUNTER — Encounter (HOSPITAL_COMMUNITY): Payer: Self-pay | Admitting: *Deleted

## 2017-11-08 ENCOUNTER — Encounter (HOSPITAL_COMMUNITY)
Admission: RE | Disposition: A | Discharge status: Home / Self Care | Payer: Self-pay | Source: Ambulatory Visit | Attending: Gastroenterology

## 2017-11-08 DIAGNOSIS — K921 Melena: Secondary | ICD-10-CM

## 2017-11-08 DIAGNOSIS — D649 Anemia, unspecified: Secondary | ICD-10-CM | POA: Insufficient documentation

## 2017-11-08 DIAGNOSIS — K922 Gastrointestinal hemorrhage, unspecified: Secondary | ICD-10-CM

## 2017-11-08 DIAGNOSIS — K449 Diaphragmatic hernia without obstruction or gangrene: Secondary | ICD-10-CM | POA: Insufficient documentation

## 2017-11-08 HISTORY — PX: GIVENS CAPSULE STUDY: SHX5432

## 2017-11-08 SURGERY — IMAGING PROCEDURE, GI TRACT, INTRALUMINAL, VIA CAPSULE

## 2017-11-08 MED ORDER — SODIUM CHLORIDE 0.9 % IV SOLN: INTRAVENOUS | Status: DC

## 2017-11-09 ENCOUNTER — Encounter (HOSPITAL_COMMUNITY): Payer: Self-pay | Admitting: Gastroenterology

## 2017-11-10 ENCOUNTER — Other Ambulatory Visit: Payer: Self-pay

## 2017-11-10 ENCOUNTER — Telehealth: Payer: Self-pay | Admitting: Gastroenterology

## 2017-11-10 ENCOUNTER — Ambulatory Visit (INDEPENDENT_AMBULATORY_CARE_PROVIDER_SITE_OTHER): Payer: Medicare Other | Admitting: Gastroenterology

## 2017-11-10 ENCOUNTER — Encounter: Payer: Self-pay | Admitting: Gastroenterology

## 2017-11-10 VITALS — BP 128/90 | HR 60 | Temp 96.8°F | Ht <= 58 in | Wt 118.2 lb

## 2017-11-10 DIAGNOSIS — K921 Melena: Secondary | ICD-10-CM

## 2017-11-10 DIAGNOSIS — R197 Diarrhea, unspecified: Secondary | ICD-10-CM | POA: Diagnosis not present

## 2017-11-10 DIAGNOSIS — D649 Anemia, unspecified: Secondary | ICD-10-CM

## 2017-11-10 NOTE — Procedures (Addendum)
HPI: ANEMIA, NORMOCYTIC(MAR 2019  Hb 8. MCV 92.3, Cr 0.84; MAY 2019 Hb 10.9, MCV 90.7 Cr 0.97),REPORTED MELENA IN MAR 2019(EGD/TCS-NO SOURCE IDENTIFIED. PT DOES HAVE A LARGE HIATAL HERNIA.   PATIENT DATA: GASTRIC PASSAGE TIME: 1H 13 m, SB PASSAGE TIME: 2H 41m  RESULTS: LIMITED views of gastric mucosa due to retained contents.  No ULCERS, masses or AVMs seen IN THE SMALL BOWEL.  LIMITED VIEWS OF THE COLON DUE TO RETAINED CONTENTS. No old blood or fresh blood in the stomach, small bowel, or colon.  DIAGNOSIS: NORMAL GIVENS STUDY. MELENA/ANEMIA DUE TO ?DIEULAFOY'S LESION OR CAMERON'S EROSIONS/ULCERS.  Plan: 1. CBC/FERRITIN IN AUG 2019 2. OPV IN NOV 2019. 3. CALL WITH QUESTIONS OR CONCERNS OR IF SHE SEES MELENA.

## 2017-11-10 NOTE — Assessment & Plan Note (Signed)
Doing well on Viberzi 100 mg BID but price is an issue (180$ for 90 days). I had discussed possibility of pancreatic enzymes in the past (history of chronic smoking, ?greasy" stools, reported in past to be postprandial, etc). Could also consider dicyclomine. As we have samples of enzymes here, will start with that.   Trial Zenpep 20,000 units. Take 2 with meals and 1 with snacks. Call in 1-2 weeks. Stop Viberzi during trial so we can discern what is truly helping. If Zenpep is not helpful, will start bentyl. Return in Sept 2019.

## 2017-11-10 NOTE — Assessment & Plan Note (Signed)
GI bleed at [facility] March 2019 with melena and Hgb in 8 range. Received 1 unit PRBCs with d/c Hgb 10.8. EGD with large hiatal hernia, early cameron's lesions, gastritis, colonoscopy with simple adenomas and diverticulosis. Capsule study recently performed was normal. Melena/anemia due to possible dieulafoy's lesion or Cameron erosions/ulcers. Plans for CBC/ferritin in Aug 2019. Discussed sign/symptoms to report.

## 2017-11-10 NOTE — Telephone Encounter (Signed)
LMOM to call. ( lab orders on file for 12/2017).

## 2017-11-10 NOTE — Telephone Encounter (Signed)
Pt is seeing Roseanne Kaufman, NP this afternoon and she will discuss with her.

## 2017-11-10 NOTE — Progress Notes (Signed)
Referring Provider: No ref. provider found Primary Care Physician:  System, Pcp Not In  Primary GI: Dr. Oneida Alar   Chief Complaint  Patient presents with  . Diarrhea    f/u. Better as long as she takes "her pills"  . Anemia    f/u.     HPI:   Angela Oconnell is a 82 y.o. female presenting today with a history of chronic diarrhea. Prior hospitalization for GI bleed at [facility] March 2019 with melena and Hgb in 8 range. Received 1 unit PRBCs with d/c Hgb 10.8. EGD with large hiatal hernia, early cameron's lesions, gastritis, colonoscopy with simple adenomas and diverticulosis. Capsule study recently performed was normal. Melena/anemia due to possible dieulafoy's lesion or Cameron erosions/ulcers. Plans for CBC/ferritin in Aug 2019.   She is doing well on Viberzi 100 mg BID but states it is 180$ for 90 day supply. Wonders if there is something else she could take instead. Certain foods set off diarrhea. Chronic history of smoking, 60+ years, now vaping. No rectal bleeding, no melena. No abdominal pain. Here with her soon to be daughter-in-law [name].       Past Medical History:  Diagnosis Date  . Anxiety   . COPD (chronic obstructive pulmonary disease) ([place])   . Depression   . Dyspnea   . GERD (gastroesophageal reflux disease)   . Hypertension     Past Surgical History:  Procedure Laterality Date  . ABDOMINAL HYSTERECTOMY    . AGILE CAPSULE N/A 10/21/2017   Procedure: AGILE CAPSULE;  Surgeon: Danie Binder, MD;  Location: [facility];  Service: Endoscopy;  Laterality: N/A;  7:30am  . BACK SURGERY    . cataract surgery Bilateral   . COLONOSCOPY    . COLONOSCOPY WITH PROPOFOL N/A 07/31/2017   simple adenomas, sigmoid diverticulosis. ?diverticular vs small bowel bleed  . CYSTECTOMY Left    Left Arm  . ESOPHAGOGASTRODUODENOSCOPY    . ESOPHAGOGASTRODUODENOSCOPY (EGD) WITH PROPOFOL N/A 07/30/2017   Dr. Oneida Alar: one moderate benign-appearing intrinsic stenosis traversed, large  hiatal hernia, early Cameron's lesions, gastritis. Recommending agile followed by givens due to GI bleed  . GIVENS CAPSULE STUDY N/A 11/08/2017   NORMAL GIVENS STUDY. MELENA/ANEMIA DUE TO ?DIEULAFOY'S LESION OR CAMERON'S EROSIONS/ULCERS.  Marland Kitchen POLYPECTOMY  07/31/2017   Procedure: POLYPECTOMY;  Surgeon: Rogene Houston, MD;  Location: [facility];  Service: Gastroenterology;;  cecal,transverse colon x2, sigmoid  . VEIN SURGERY      Current Outpatient Medications  Medication Sig Dispense Refill  . atorvastatin (LIPITOR) 10 MG tablet Take 10 mg by mouth daily.    . carisoprodol (SOMA) 350 MG tablet Take 350 mg by mouth at bedtime as needed.     . clonazePAM (KLONOPIN) 0.5 MG tablet Take 0.5 mg by mouth 2 (two) times daily as needed for anxiety.    . Eluxadoline (VIBERZI) 100 MG TABS Take 1 tablet by mouth 2 (two) times daily.    Marland Kitchen FLUoxetine (PROZAC) 20 MG tablet Take 60 mg by mouth daily.    Marland Kitchen gabapentin (NEURONTIN) 300 MG capsule Take 100 mg by mouth 3 (three) times daily.    . pantoprazole (PROTONIX) 40 MG tablet Take 40 mg by mouth daily.    . propranolol (INDERAL) 80 MG tablet Take 80 mg by mouth daily.      No current facility-administered medications for this visit.     Allergies as of 11/10/2017 - Review Complete 11/10/2017  Allergen Reaction Noted  . Motrin [ibuprofen] Other (  See Comments) 07/30/2017  . Sulfur Nausea And Vomiting 07/30/2017    Family History  Problem Relation Age of Onset  . Colon cancer Neg Hx   . Colon polyps Neg Hx     Social History   Socioeconomic History  . Marital status: Unknown    Spouse name: Not on file  . Number of children: Not on file  . Years of education: Not on file  . Highest education level: Not on file  Occupational History  . Not on file  Social Needs  . Financial resource strain: Not on file  . Food insecurity:    Worry: Not on file    Inability: Not on file  . Transportation needs:    Medical: Not on file    Non-medical:  Not on file  Tobacco Use  . Smoking status: Former Smoker    Years: 60.00  . Smokeless tobacco: Never Used  . Tobacco comment: used to smoke for 60 years, no longer smoking  Substance and Sexual Activity  . Alcohol use: No    Frequency: Never  . Drug use: No  . Sexual activity: Never  Lifestyle  . Physical activity:    Days per week: Not on file    Minutes per session: Not on file  . Stress: Not on file  Relationships  . Social connections:    Talks on phone: Not on file    Gets together: Not on file    Attends religious service: Not on file    Active member of club or organization: Not on file    Attends meetings of clubs or organizations: Not on file    Relationship status: Not on file  Other Topics Concern  . Not on file  Social History Narrative  . Not on file    Review of Systems: Gen: Denies fever, chills, anorexia. Denies fatigue, weakness, weight loss.  CV: Denies chest pain, palpitations, syncope, peripheral edema, and claudication. Resp: Denies dyspnea at rest, cough, wheezing, coughing up blood, and pleurisy. GI: see HPI  Derm: Denies rash, itching, dry skin Psych: Denies depression, anxiety, memory loss, confusion. No homicidal or suicidal ideation.  Heme: Denies bruising, bleeding, and enlarged lymph nodes.  Physical Exam: BP 128/90   Pulse 60   Temp (!) 96.8 F (36 C) (Oral)   Ht 4\' 10"  (1.473 m)   Wt 118 lb 3.2 oz (53.6 kg)   BMI 24.70 kg/m  General:   Alert and oriented. No distress noted. Pleasant and cooperative.  Head:  Normocephalic and atraumatic. Eyes:  Conjuctiva clear without scleral icterus. Mouth:  Oral mucosa pink and moist.  Abdomen:  +BS, soft, non-tender and non-distended. No rebound or guarding.  Msk:  Kyphosis  Extremities:  Without edema. Neurologic:  Alert and  oriented x4 Psych:  Alert and cooperative. Normal mood and affect.  Lab Results  Component Value Date   WBC 5.1 09/24/2017   HGB 10.9 (L) 09/24/2017   HCT 34.1 (L)  09/24/2017   MCV 90.7 09/24/2017   PLT 205 09/24/2017

## 2017-11-10 NOTE — Patient Instructions (Signed)
I would like for you to do the following:  1. Stop the Viberzi for now. 2. Start the Zenpep samples. Take 2 capsules WITH FOOD and only 1 WITH SNACKS. 3. Call me with an update in 1-2 weeks. 4. We will decide what medication you need to do at that point.  You will have a reminder for blood work in August. I will see you in September!  Call us if any black, tarry stool!   It was a pleasure to see you today. I strive to create trusting relationships with patients to provide genuine, compassionate, and quality care. I value your feedback. If you receive a survey regarding your visit,  I greatly appreciate you taking time to fill this out.   Annitta Needs, PhD, ANP-BC [facility]

## 2017-11-10 NOTE — Telephone Encounter (Signed)
PLEASE CALL PT. HER GIVENS STUDY IS NORMAL. NO SOURCE FOR HER ANEMIA/MELENA WAS IDENTIFIED.    Plan: 1. NEXT CBC/FERRITIN IN AUG 2019 2. OPV IN NOV 2019 E30 OBSCURE GI BLEED/MELENA 3.PLEAS CALL WITH QUESTIONS OR CONCERNS OR IF SHE SEE BLACK TARRY STOOLS.

## 2017-11-11 ENCOUNTER — Encounter: Payer: Self-pay | Admitting: Gastroenterology

## 2017-11-11 NOTE — Telephone Encounter (Signed)
Reminder in epic °

## 2017-11-24 ENCOUNTER — Telehealth: Payer: Self-pay | Admitting: Gastroenterology

## 2017-11-24 NOTE — Telephone Encounter (Signed)
Recall for cbc/ferritin 12/2017

## 2017-11-26 NOTE — Telephone Encounter (Signed)
Lab orders on file to be mailed to pt.

## 2017-12-14 ENCOUNTER — Other Ambulatory Visit: Payer: Self-pay

## 2017-12-14 DIAGNOSIS — D649 Anemia, unspecified: Secondary | ICD-10-CM

## 2018-02-09 ENCOUNTER — Encounter: Payer: Self-pay | Admitting: Gastroenterology

## 2018-02-09 ENCOUNTER — Ambulatory Visit (INDEPENDENT_AMBULATORY_CARE_PROVIDER_SITE_OTHER): Payer: Medicare Other | Admitting: Gastroenterology

## 2018-02-09 VITALS — BP 126/72 | HR 60 | Temp 97.2°F | Ht <= 58 in | Wt 122.6 lb

## 2018-02-09 DIAGNOSIS — R197 Diarrhea, unspecified: Secondary | ICD-10-CM | POA: Diagnosis not present

## 2018-02-09 NOTE — Patient Instructions (Signed)
I am glad you are doing well! Continue Viberzi as you are doing.  We will see you in 1 year!  I am trying to get the blood work from your primary care doctor.  It was a pleasure to see you today. I strive to create trusting relationships with patients to provide genuine, compassionate, and quality care. I value your feedback. If you receive a survey regarding your visit,  I greatly appreciate you taking time to fill this out.   Annitta Needs, PhD, ANP-BC [facility]

## 2018-02-09 NOTE — Progress Notes (Signed)
CC'D TO PCP °

## 2018-02-09 NOTE — Assessment & Plan Note (Addendum)
Doing well on Viberzi 100 mg BID. Despite higher copay, this is working well for her, and she desires to continue. No alarm features. Return in 1 year or sooner if needed. Will attempt to retrieve recent labs from PCP.

## 2018-02-09 NOTE — Progress Notes (Signed)
Referring Provider: No ref. provider found Primary Care Physician:  Beola Cord, FNP  Primary GI: Dr. Oneida Alar   Chief Complaint  Patient presents with  . Diarrhea    HPI:   Angela Oconnell is a 82 y.o. female presenting today with a history of chronic diarrhea. Prior hospitalization for GI bleed at [facility] March 2019 with melena and Hgb in 8 range. Received 1 unit PRBCs with d/c Hgb 10.8. EGD with large hiatal hernia, early cameron's lesions, gastritis, colonoscopy with simple adenomas and diverticulosis. Capsule study recently performed was normal. Melena/anemia due to possible dieulafoy's lesion or Cameron erosions/ulcers.   Was on Viberzi in June 2019 but copay was expensive. Started her on Zenpep in case of pancreatic insuffiency. She is due for CBC, ferritin now.   States Zenpep didn't help. She is back on Viberzi and doing well. No rectal bleeding. No melena. No abdominal pain, N/V. No GI concerns today. States PCP did blood work recently.   Past Medical History:  Diagnosis Date  . Anxiety   . COPD (chronic obstructive pulmonary disease) ([place])   . Depression   . Dyspnea   . GERD (gastroesophageal reflux disease)   . Hypertension     Past Surgical History:  Procedure Laterality Date  . ABDOMINAL HYSTERECTOMY    . AGILE CAPSULE N/A 10/21/2017   Procedure: AGILE CAPSULE;  Surgeon: Danie Binder, MD;  Location: [facility];  Service: Endoscopy;  Laterality: N/A;  7:30am  . BACK SURGERY    . cataract surgery Bilateral   . COLONOSCOPY    . COLONOSCOPY WITH PROPOFOL N/A 07/31/2017   simple adenomas, sigmoid diverticulosis. ?diverticular vs small bowel bleed  . CYSTECTOMY Left    Left Arm  . ESOPHAGOGASTRODUODENOSCOPY    . ESOPHAGOGASTRODUODENOSCOPY (EGD) WITH PROPOFOL N/A 07/30/2017   Dr. Oneida Alar: one moderate benign-appearing intrinsic stenosis traversed, large hiatal hernia, early Cameron's lesions, gastritis. Recommending agile followed by givens due to GI bleed    . GIVENS CAPSULE STUDY N/A 11/08/2017   NORMAL GIVENS STUDY. MELENA/ANEMIA DUE TO ?DIEULAFOY'S LESION OR CAMERON'S EROSIONS/ULCERS.  Marland Kitchen POLYPECTOMY  07/31/2017   Procedure: POLYPECTOMY;  Surgeon: Rogene Houston, MD;  Location: [facility];  Service: Gastroenterology;;  cecal,transverse colon x2, sigmoid  . VEIN SURGERY      Current Outpatient Medications  Medication Sig Dispense Refill  . atorvastatin (LIPITOR) 10 MG tablet Take 10 mg by mouth daily.    . clonazePAM (KLONOPIN) 0.5 MG tablet Take 0.5 mg by mouth 2 (two) times daily as needed for anxiety.    . Eluxadoline (VIBERZI) 100 MG TABS Take 1 tablet by mouth 2 (two) times daily.    Marland Kitchen FLUoxetine (PROZAC) 20 MG tablet Take 60 mg by mouth daily.    Marland Kitchen gabapentin (NEURONTIN) 300 MG capsule Take 100 mg by mouth 3 (three) times daily.    . pantoprazole (PROTONIX) 40 MG tablet Take 40 mg by mouth daily.    . propranolol (INDERAL) 80 MG tablet Take 80 mg by mouth daily.      No current facility-administered medications for this visit.     Allergies as of 02/09/2018 - Review Complete 02/09/2018  Allergen Reaction Noted  . Motrin [ibuprofen] Other (See Comments) 07/30/2017  . Sulfur Nausea And Vomiting 07/30/2017    Family History  Problem Relation Age of Onset  . Colon cancer Neg Hx   . Colon polyps Neg Hx     Social History   Socioeconomic History  . Marital  status: Unknown    Spouse name: Not on file  . Number of children: Not on file  . Years of education: Not on file  . Highest education level: Not on file  Occupational History  . Not on file  Social Needs  . Financial resource strain: Not on file  . Food insecurity:    Worry: Not on file    Inability: Not on file  . Transportation needs:    Medical: Not on file    Non-medical: Not on file  Tobacco Use  . Smoking status: Former Smoker    Years: 60.00  . Smokeless tobacco: Never Used  . Tobacco comment: used to smoke for 60 years, no longer smoking  Substance  and Sexual Activity  . Alcohol use: No    Frequency: Never  . Drug use: No  . Sexual activity: Never  Lifestyle  . Physical activity:    Days per week: Not on file    Minutes per session: Not on file  . Stress: Not on file  Relationships  . Social connections:    Talks on phone: Not on file    Gets together: Not on file    Attends religious service: Not on file    Active member of club or organization: Not on file    Attends meetings of clubs or organizations: Not on file    Relationship status: Not on file  Other Topics Concern  . Not on file  Social History Narrative  . Not on file    Review of Systems: Gen: Denies fever, chills, anorexia. Denies fatigue, weakness, weight loss.  CV: Denies chest pain, palpitations, syncope, peripheral edema, and claudication. Resp: Denies dyspnea at rest, cough, wheezing, coughing up blood, and pleurisy. GI: see HPI  Derm: Denies rash, itching, dry skin Psych: Denies depression, anxiety, memory loss, confusion. No homicidal or suicidal ideation.  Heme: Denies bruising, bleeding, and enlarged lymph nodes.  Physical Exam: BP 126/72   Pulse 60   Temp (!) 97.2 F (36.2 C) (Oral)   Ht 4\' 10"  (1.473 m)   Wt 122 lb 9.6 oz (55.6 kg)   BMI 25.62 kg/m  General:   Alert and oriented. No distress noted. Pleasant and cooperative.  Head:  Normocephalic and atraumatic. Eyes:  Conjuctiva clear without scleral icterus. Mouth:  Oral mucosa pink and moist.  Abdomen:  +BS, soft, non-tender and non-distended. No rebound or guarding. No HSM or masses noted. Msk:  Slight kyphosis  Extremities:  Without edema. Neurologic:  Alert and  oriented x4 Psych:  Alert and cooperative. Normal mood and affect.

## 2018-03-03 ENCOUNTER — Encounter: Payer: Self-pay | Admitting: Gastroenterology

## 2018-03-29 NOTE — Progress Notes (Signed)
Labs from Sept 2019: Hgb 10.9, Hct 37.1, Platelets 287, Creatinine 1.25, GFR 41. No ferritin. Likely anemia of chronic disease.

## 2018-05-25 DEATH — deceased

## 2019-01-25 ENCOUNTER — Encounter: Payer: Self-pay | Admitting: Gastroenterology

## 2019-09-24 IMAGING — DX DG ABDOMEN 1V
1 series · 1 of 1 positions shown · non-contrast
Comparison: None.

CLINICAL DATA: Check placement of agile capsule.

EXAM:
ABDOMEN - 1 VIEW

[abdomen kub]
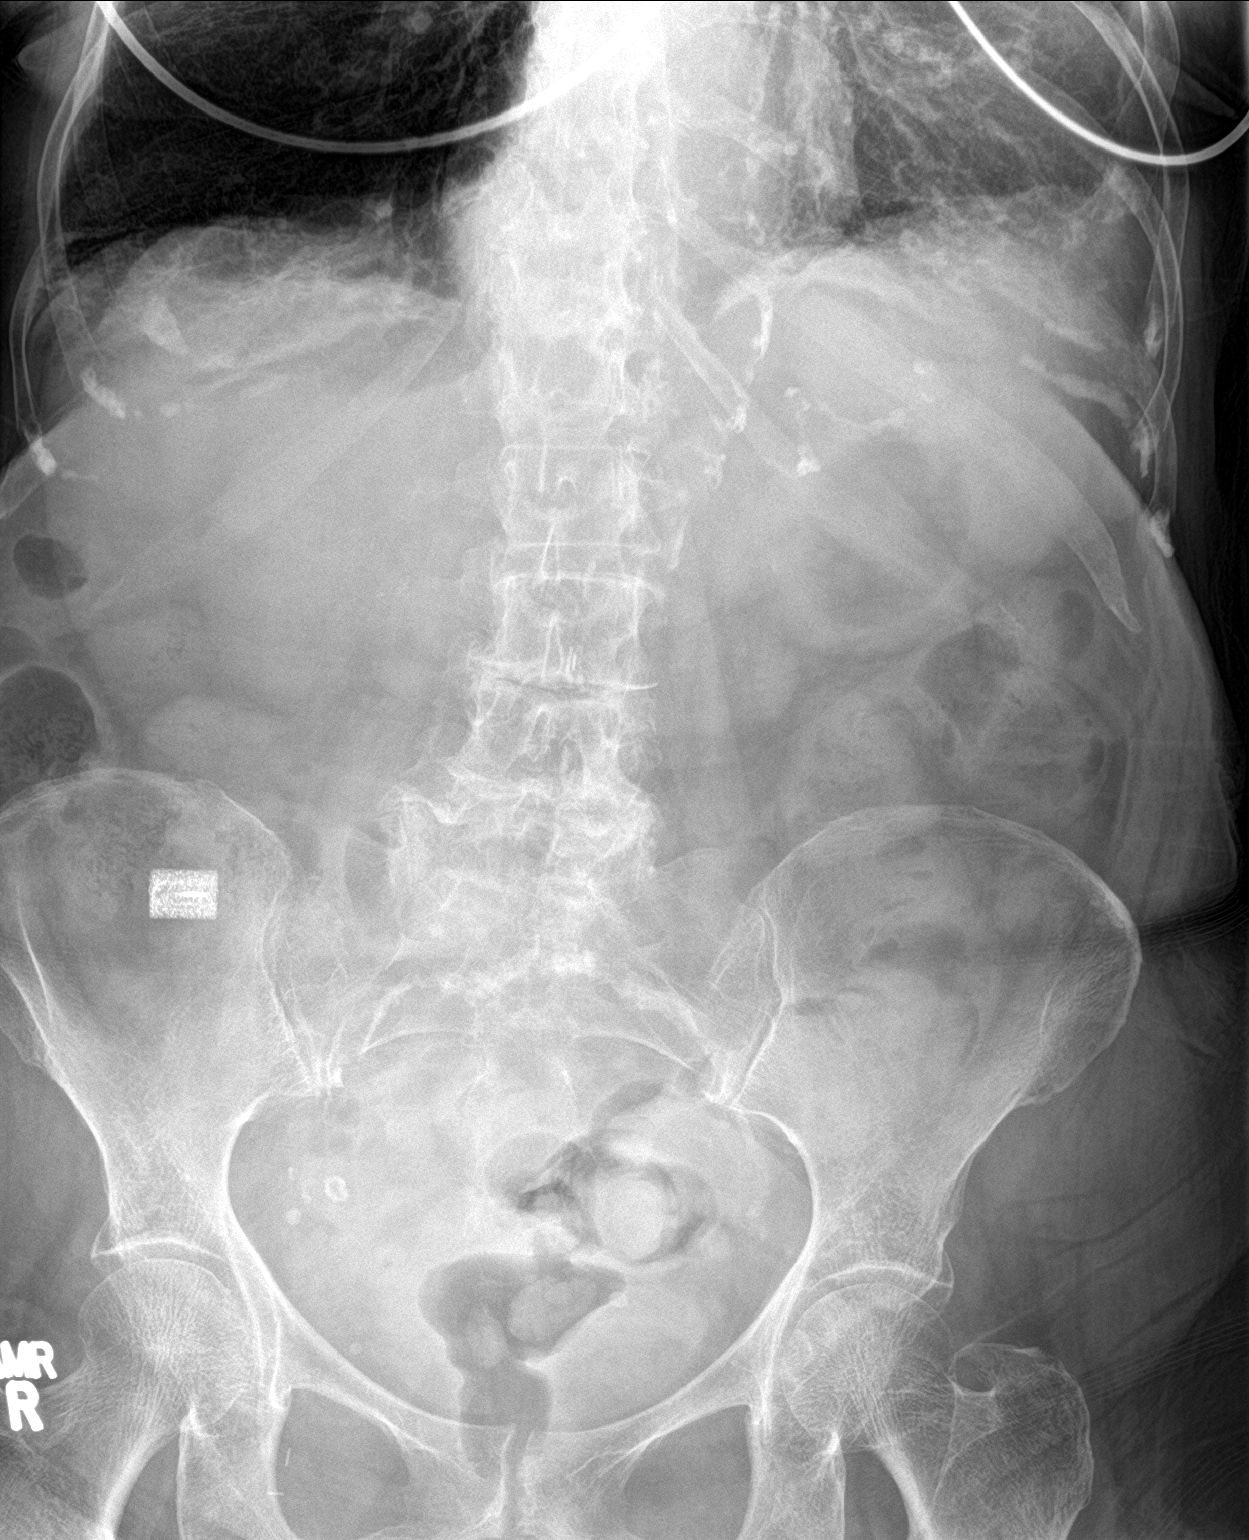

[1 of 1 positions shown; findings below may reference images not displayed]

FINDINGS: The bowel gas pattern is normal. Phleboliths are noted in the
pelvis. Agile capsule is seen in the right lower quadrant of the
abdomen.
IMPRESSION: Agile capsule seen in right lower quadrant of abdomen.
